# Patient Record
Sex: Female | Born: 1985 | Race: White | Hispanic: Yes | Marital: Single | Smoking: Current some day smoker
Health system: Southern US, Community
[De-identification: ages and names within clinical notes are randomized; demographics above are authoritative.]

## PROBLEM LIST (undated history)

## (undated) DIAGNOSIS — F319 Bipolar disorder, unspecified: Secondary | ICD-10-CM

## (undated) DIAGNOSIS — F32A Depression, unspecified: Secondary | ICD-10-CM

## (undated) DIAGNOSIS — I499 Cardiac arrhythmia, unspecified: Secondary | ICD-10-CM

## (undated) DIAGNOSIS — K219 Gastro-esophageal reflux disease without esophagitis: Secondary | ICD-10-CM

## (undated) DIAGNOSIS — J45909 Unspecified asthma, uncomplicated: Secondary | ICD-10-CM

## (undated) DIAGNOSIS — F329 Major depressive disorder, single episode, unspecified: Secondary | ICD-10-CM

## (undated) HISTORY — DX: Gastro-esophageal reflux disease without esophagitis: K21.9

## (undated) HISTORY — DX: Depression, unspecified: F32.A

## (undated) HISTORY — DX: Bipolar disorder, unspecified: F31.9

## (undated) HISTORY — DX: Cardiac arrhythmia, unspecified: I49.9

## (undated) HISTORY — DX: Major depressive disorder, single episode, unspecified: F32.9

## (undated) HISTORY — DX: Unspecified asthma, uncomplicated: J45.909

---

## 2004-08-25 ENCOUNTER — Ambulatory Visit: Payer: Self-pay | Admitting: Unknown Physician Specialty

## 2004-11-02 ENCOUNTER — Emergency Department: Payer: Self-pay | Admitting: Emergency Medicine

## 2004-11-08 ENCOUNTER — Emergency Department (HOSPITAL_COMMUNITY): Admission: EM | Admit: 2004-11-08 | Discharge: 2004-11-08 | Payer: Self-pay | Admitting: Family Medicine

## 2004-12-05 ENCOUNTER — Ambulatory Visit (HOSPITAL_COMMUNITY): Admission: RE | Admit: 2004-12-05 | Discharge: 2004-12-05 | Payer: Self-pay | Admitting: Gastroenterology

## 2006-02-20 ENCOUNTER — Observation Stay: Payer: Self-pay

## 2006-02-21 ENCOUNTER — Inpatient Hospital Stay: Payer: Self-pay

## 2006-10-05 ENCOUNTER — Emergency Department: Payer: Self-pay

## 2008-04-13 ENCOUNTER — Ambulatory Visit: Payer: Self-pay | Admitting: Family Medicine

## 2009-09-14 HISTORY — PX: TONSILLECTOMY: SHX5217

## 2012-12-01 DIAGNOSIS — M439 Deforming dorsopathy, unspecified: Secondary | ICD-10-CM | POA: Insufficient documentation

## 2012-12-08 DIAGNOSIS — M7580 Other shoulder lesions, unspecified shoulder: Secondary | ICD-10-CM | POA: Insufficient documentation

## 2012-12-08 DIAGNOSIS — M797 Fibromyalgia: Secondary | ICD-10-CM | POA: Insufficient documentation

## 2012-12-08 DIAGNOSIS — M7540 Impingement syndrome of unspecified shoulder: Secondary | ICD-10-CM | POA: Insufficient documentation

## 2013-08-01 ENCOUNTER — Emergency Department: Payer: Self-pay | Admitting: Emergency Medicine

## 2013-08-01 LAB — CBC WITH DIFFERENTIAL/PLATELET
Basophil #: 0.1 10*3/uL (ref 0.0–0.1)
Eosinophil #: 0 10*3/uL (ref 0.0–0.7)
Eosinophil %: 0.6 %
HGB: 12.8 g/dL (ref 12.0–16.0)
MCH: 32.3 pg (ref 26.0–34.0)
MCHC: 36.1 g/dL — ABNORMAL HIGH (ref 32.0–36.0)
MCV: 89 fL (ref 80–100)
Monocyte #: 0.5 x10 3/mm (ref 0.2–0.9)
Neutrophil #: 6.2 10*3/uL (ref 1.4–6.5)
Neutrophil %: 77 %
Platelet: 260 10*3/uL (ref 150–440)
RBC: 3.97 10*6/uL (ref 3.80–5.20)
RDW: 12.8 % (ref 11.5–14.5)

## 2013-08-01 LAB — URINALYSIS, COMPLETE
Bacteria: NONE SEEN
Bilirubin,UR: NEGATIVE
Blood: NEGATIVE
Glucose,UR: NEGATIVE mg/dL (ref 0–75)
Ketone: NEGATIVE
Nitrite: NEGATIVE
Ph: 6 (ref 4.5–8.0)
RBC,UR: 1 /HPF (ref 0–5)
Specific Gravity: 1.014 (ref 1.003–1.030)

## 2013-08-01 LAB — COMPREHENSIVE METABOLIC PANEL
Alkaline Phosphatase: 76 U/L (ref 50–136)
Anion Gap: 6 — ABNORMAL LOW (ref 7–16)
Bilirubin,Total: 0.4 mg/dL (ref 0.2–1.0)
Calcium, Total: 8.5 mg/dL (ref 8.5–10.1)
EGFR (African American): >60
EGFR (Non-African Amer.): >60
Glucose: 78 mg/dL (ref 65–99)
SGPT (ALT): 19 U/L (ref 12–78)
Sodium: 135 mmol/L — ABNORMAL LOW (ref 136–145)
Total Protein: 7 g/dL (ref 6.4–8.2)

## 2013-10-27 ENCOUNTER — Observation Stay: Payer: Self-pay | Admitting: Obstetrics and Gynecology

## 2013-12-06 ENCOUNTER — Observation Stay: Payer: Self-pay | Admitting: Obstetrics & Gynecology

## 2013-12-14 ENCOUNTER — Observation Stay: Payer: Self-pay | Admitting: Obstetrics and Gynecology

## 2013-12-15 ENCOUNTER — Inpatient Hospital Stay: Payer: Self-pay | Admitting: Obstetrics and Gynecology

## 2013-12-15 LAB — BASIC METABOLIC PANEL
ANION GAP: 8 (ref 7–16)
BUN: 5 mg/dL — AB (ref 7–18)
CALCIUM: 8.4 mg/dL — AB (ref 8.5–10.1)
CHLORIDE: 104 mmol/L (ref 98–107)
CO2: 24 mmol/L (ref 21–32)
CREATININE: 0.52 mg/dL — AB (ref 0.60–1.30)
EGFR (African American): >60
EGFR (Non-African Amer.): >60
GLUCOSE: 86 mg/dL (ref 65–99)
OSMOLALITY: 269 (ref 275–301)
Potassium: 3.6 mmol/L (ref 3.5–5.1)
SODIUM: 136 mmol/L (ref 136–145)

## 2013-12-15 LAB — CBC WITH DIFFERENTIAL/PLATELET
Basophil #: 0.1 10*3/uL (ref 0.0–0.1)
Basophil %: 0.7 %
EOS PCT: 0.6 %
Eosinophil #: 0.1 10*3/uL (ref 0.0–0.7)
HCT: 33.3 % — AB (ref 35.0–47.0)
HGB: 11.3 g/dL — ABNORMAL LOW (ref 12.0–16.0)
LYMPHS ABS: 1.1 10*3/uL (ref 1.0–3.6)
Lymphocyte %: 10.2 %
MCH: 30.4 pg (ref 26.0–34.0)
MCHC: 34 g/dL (ref 32.0–36.0)
MCV: 90 fL (ref 80–100)
Monocyte #: 0.9 x10 3/mm (ref 0.2–0.9)
Monocyte %: 8.3 %
NEUTROS ABS: 8.9 10*3/uL — AB (ref 1.4–6.5)
Neutrophil %: 80.2 %
Platelet: 218 10*3/uL (ref 150–440)
RBC: 3.72 10*6/uL — ABNORMAL LOW (ref 3.80–5.20)
RDW: 14 % (ref 11.5–14.5)
WBC: 11.1 10*3/uL — AB (ref 3.6–11.0)

## 2013-12-15 LAB — URINALYSIS, COMPLETE
BILIRUBIN, UR: NEGATIVE
Blood: NEGATIVE
Glucose,UR: NEGATIVE mg/dL (ref 0–75)
Ketone: NEGATIVE
LEUKOCYTE ESTERASE: NEGATIVE
Nitrite: NEGATIVE
PROTEIN: NEGATIVE
Ph: 6 (ref 4.5–8.0)
RBC,UR: NONE SEEN /HPF (ref 0–5)
Specific Gravity: 1.017 (ref 1.003–1.030)

## 2013-12-16 ENCOUNTER — Ambulatory Visit: Payer: Self-pay | Admitting: Obstetrics and Gynecology

## 2013-12-18 ENCOUNTER — Observation Stay: Payer: Self-pay

## 2013-12-18 LAB — BETA STREP CULTURE(ARMC)

## 2013-12-25 ENCOUNTER — Observation Stay: Payer: Self-pay

## 2013-12-28 ENCOUNTER — Observation Stay: Payer: Self-pay

## 2013-12-28 LAB — URINALYSIS, COMPLETE
BACTERIA: NONE SEEN
Bilirubin,UR: NEGATIVE
Blood: NEGATIVE
GLUCOSE, UR: NEGATIVE mg/dL (ref 0–75)
Ketone: NEGATIVE
LEUKOCYTE ESTERASE: NEGATIVE
Nitrite: NEGATIVE
PH: 6 (ref 4.5–8.0)
Protein: NEGATIVE
RBC, UR: NONE SEEN /HPF (ref 0–5)
SPECIFIC GRAVITY: 1.014 (ref 1.003–1.030)

## 2013-12-28 LAB — FETAL FIBRONECTIN
APPEARANCE: NORMAL
Fetal Fibronectin: NEGATIVE

## 2013-12-29 ENCOUNTER — Observation Stay: Payer: Self-pay | Admitting: Obstetrics & Gynecology

## 2013-12-29 LAB — URINALYSIS, COMPLETE
BLOOD: NEGATIVE
Bilirubin,UR: NEGATIVE
GLUCOSE, UR: NEGATIVE mg/dL (ref 0–75)
LEUKOCYTE ESTERASE: NEGATIVE
Nitrite: NEGATIVE
PH: 6 (ref 4.5–8.0)
Protein: NEGATIVE
RBC,UR: <1 /HPF (ref 0–5)
SPECIFIC GRAVITY: 1.021 (ref 1.003–1.030)
Squamous Epithelial: 1

## 2014-01-01 ENCOUNTER — Observation Stay: Payer: Self-pay

## 2014-01-03 ENCOUNTER — Observation Stay: Payer: Self-pay

## 2014-01-08 ENCOUNTER — Observation Stay: Payer: Self-pay | Admitting: Obstetrics and Gynecology

## 2014-01-12 ENCOUNTER — Inpatient Hospital Stay: Payer: Self-pay | Admitting: Obstetrics and Gynecology

## 2014-01-13 LAB — CBC WITH DIFFERENTIAL/PLATELET
BASOS ABS: 0 10*3/uL (ref 0.0–0.1)
BASOS PCT: 0.4 %
Eosinophil #: 0 10*3/uL (ref 0.0–0.7)
Eosinophil %: 0.3 %
HCT: 36 % (ref 35.0–47.0)
HGB: 12.2 g/dL (ref 12.0–16.0)
LYMPHS PCT: 9.2 %
Lymphocyte #: 1 10*3/uL (ref 1.0–3.6)
MCH: 29.7 pg (ref 26.0–34.0)
MCHC: 33.8 g/dL (ref 32.0–36.0)
MCV: 88 fL (ref 80–100)
Monocyte #: 0.8 x10 3/mm (ref 0.2–0.9)
Monocyte %: 7.3 %
NEUTROS ABS: 9 10*3/uL — AB (ref 1.4–6.5)
NEUTROS PCT: 82.8 %
Platelet: 208 10*3/uL (ref 150–440)
RBC: 4.1 10*6/uL (ref 3.80–5.20)
RDW: 15.6 % — AB (ref 11.5–14.5)
WBC: 10.9 10*3/uL (ref 3.6–11.0)

## 2014-01-14 LAB — HEMATOCRIT
HCT: 22.6 % — AB (ref 35.0–47.0)
HCT: 27.3 % — ABNORMAL LOW (ref 35.0–47.0)

## 2014-01-15 LAB — HEMATOCRIT: HCT: 25 % — ABNORMAL LOW (ref 35.0–47.0)

## 2014-01-16 LAB — CBC WITH DIFFERENTIAL/PLATELET
Bands: 3 %
Eosinophil: 1 %
HCT: 26.2 % — AB (ref 35.0–47.0)
HGB: 8.9 g/dL — ABNORMAL LOW (ref 12.0–16.0)
Lymphocytes: 18 %
MCH: 29.9 pg (ref 26.0–34.0)
MCHC: 34 g/dL (ref 32.0–36.0)
MCV: 88 fL (ref 80–100)
Monocytes: 4 %
Myelocyte: 1 %
Platelet: 201 10*3/uL (ref 150–440)
RBC: 2.98 10*6/uL — ABNORMAL LOW (ref 3.80–5.20)
RDW: 15.4 % — ABNORMAL HIGH (ref 11.5–14.5)
SEGMENTED NEUTROPHILS: 73 %
WBC: 7.7 10*3/uL (ref 3.6–11.0)

## 2014-01-17 LAB — PATHOLOGY REPORT

## 2014-09-05 ENCOUNTER — Encounter: Payer: Self-pay | Admitting: Podiatry

## 2014-09-05 ENCOUNTER — Other Ambulatory Visit: Payer: Self-pay | Admitting: Podiatry

## 2014-09-05 ENCOUNTER — Ambulatory Visit (INDEPENDENT_AMBULATORY_CARE_PROVIDER_SITE_OTHER): Payer: BC Managed Care – PPO

## 2014-09-05 ENCOUNTER — Ambulatory Visit (INDEPENDENT_AMBULATORY_CARE_PROVIDER_SITE_OTHER): Payer: BC Managed Care – PPO | Admitting: Podiatry

## 2014-09-05 VITALS — BP 126/74 | HR 70 | Resp 12

## 2014-09-05 DIAGNOSIS — R52 Pain, unspecified: Secondary | ICD-10-CM

## 2014-09-05 DIAGNOSIS — M722 Plantar fascial fibromatosis: Secondary | ICD-10-CM

## 2014-09-05 NOTE — Patient Instructions (Signed)
As your pediatrician about Use of ibuprofen if he approves okay to take 800 mg by mouth 3 times a day Ask about Kenalog 10 mg injected into the right and left heel for treatment of plantar fasciitis  Bent - Knee Calf Stretch  1) Stand an arm's length away from a wall. Place the palms of your hands on the wall. Step forward about 12 inches with the opposite foot.  2) Keeping toes pointed forward and both heels on the floor, bend both knees and lean forward. Hold this position for 60 seconds. Don't arch your back and don't hunch your shoulders.  3) Repeat this twice.  DO THIS STRETCHING TECHNIQUE 3 TIMES A DAY.   Stretching Exercises before Standing  Pull your toes up toward your nose and hold for 1 minute before standing.  A towel can assist with this exercise if you put the towel under the ball of your foot. This exercise reduces the intense   pain associated when changing from a seated to a standing position. This stretch can usually be the most beneficial if done before getting out of bed in the mornings. Plantar Fasciitis Plantar fasciitis is a common condition that causes foot pain. It is soreness (inflammation) of the band of tough fibrous tissue on the bottom of the foot that runs from the heel bone (calcaneus) to the ball of the foot. The cause of this soreness may be from excessive standing, poor fitting shoes, running on hard surfaces, being overweight, having an abnormal walk, or overuse (this is common in runners) of the painful foot or feet. It is also common in aerobic exercise dancers and ballet dancers. SYMPTOMS  Most people with plantar fasciitis complain of:  Severe pain in the morning on the bottom of their foot especially when taking the first steps out of bed. This pain recedes after a few minutes of walking.  Severe pain is experienced also during walking following a long period of inactivity.  Pain is worse when walking barefoot or up stairs DIAGNOSIS   Your  caregiver will diagnose this condition by examining and feeling your foot.  Special tests such as X-rays of your foot, are usually not needed. PREVENTION   Consult a sports medicine professional before beginning a new exercise program.  Walking programs offer a good workout. With walking there is a lower chance of overuse injuries common to runners. There is less impact and less jarring of the joints.  Begin all new exercise programs slowly. If problems or pain develop, decrease the amount of time or distance until you are at a comfortable level.  Wear good shoes and replace them regularly.  Stretch your foot and the heel cords at the back of the ankle (Achilles tendon) both before and after exercise.  Run or exercise on even surfaces that are not hard. For example, asphalt is better than pavement.  Do not run barefoot on hard surfaces.  If using a treadmill, vary the incline.  Do not continue to workout if you have foot or joint problems. Seek professional help if they do not improve. HOME CARE INSTRUCTIONS   Avoid activities that cause you pain until you recover.  Use ice or cold packs on the problem or painful areas after working out.  Only take over-the-counter or prescription medicines for pain, discomfort, or fever as directed by your caregiver.  Soft shoe inserts or athletic shoes with air or gel sole cushions may be helpful.  If problems continue or become more severe,  consult a sports medicine caregiver or your own health care provider. Cortisone is a potent anti-inflammatory medication that may be injected into the painful area. You can discuss this treatment with your caregiver. MAKE SURE YOU:   Understand these instructions.  Will watch your condition.  Will get help right away if you are not doing well or get worse. Document Released: 05/26/2001 Document Revised: 11/23/2011 Document Reviewed: 07/25/2008 [facility] Patient Information 2015 [employer] [place]. This  information is not intended to replace advice given to you by your health care provider. Make sure you discuss any questions you have with your health care provider.

## 2014-09-05 NOTE — Progress Notes (Signed)
   Subjective:    Patient ID: Lindsay Brown, female    DOB: 1986-01-20, 28 y.o.   MRN: [id number]  HPI  N-SORE L-B/L HEEL AND ARCH D-5 MONTHS O-SLOWLY C-WORSE A-PRESSURE T-INSERTS, ICING, IBUPROFEN   Patient complaining of bilateral inferior heel arch pain aggravated with standing walking relieved with rest. She is currently taking over-the-counter ibuprofen up to 1200 mg daily without a complaint. She also has visited chiropractor who has ordered her foot orthotics which she has not received yet she was told that she has a flat foot that is collapsing  Patient mother of twins in the past 7 months has had a 60 pound increase in weight since pregnancy and has lost 10 pounds. Patient is a nursing mother.  Review of Systems  Constitutional: Positive for unexpected weight change.  Musculoskeletal: Positive for back pain and gait problem.  All other systems reviewed and are negative.      Objective:   Physical Exam  Orientated 3  Vascular: DP and PT pulses 2/4 bilaterally Capillary reflex immediate bilaterally  Neurological: Ankle reflex equal and reactive bilaterally Vibratory sensation intact bilaterally Sensation to 10 g monofilament wire intact 5/5 bilaterally  Dermatological: Texture and turgor within normal limits  Musculoskeletal: Medial longitudinal arch bilaterally on weightbearing Upon heel off heels invert bilaterally The feet appear rectus and heels are in a vertical position upon weight-bearing  There is exquisite palpable tenderness medial plantar and proximal one third of the fascial band right without any palpable lesions There is palpable tenderness medial plantar fascial band left at the insertional area without any palpable lesions  There is no restriction ankle, subtalar, midtarsal joints bilaterally  X-ray examination weightbearing right foot  Intact bony structure without fracture and/or dislocation noted No evidence of arthritic  changes   Radiographic impression: No acute bony abnormality noted in the right foot   X-ray examination weightbearing left foot  Intact bony structure without a fracture and/or dislocation No arthritic changes  Radiographic impression: No acute bony abnormality noted left foot       Assessment & Plan:    Assessment: Satisfactory neurovascular status Bilateral plantar fasciitis Satisfactory alignment without any evidence of foot deformity  Plan: Advised patient that her heel and arch pain was resulting from plantar fasciitis. Also, I made patient aware that she had no evidence of a collapsing foot type that another doctor suggested that she had.  Patient is a nursing mother and states that her pediatrician is okay ibuprofen. Advised her to confirm with pediatrician that she could continue taking ibuprofen and she could use up to 2400 mg of ibuprofen divided on a 3 times a day basis  Patient has a pending orthotics from chiropractor I will defer on custom orthotics at this time. I advised her to wear the orthotics when she receives them from the chiropractor and if the symptoms did not of improve return for evaluation.  Also, I asked patient to assess pediatrician whether he would approve a Kenalog Injection for plantar fasciitis in regards to nursing.  Shoeing and stretching discussed  Reappoint at patient's request

## 2014-09-11 ENCOUNTER — Ambulatory Visit (INDEPENDENT_AMBULATORY_CARE_PROVIDER_SITE_OTHER): Payer: BC Managed Care – PPO | Admitting: Podiatry

## 2014-09-11 VITALS — BP 117/78 | HR 86 | Resp 16

## 2014-09-11 DIAGNOSIS — M722 Plantar fascial fibromatosis: Secondary | ICD-10-CM

## 2014-09-11 MED ORDER — TRIAMCINOLONE ACETONIDE 10 MG/ML IJ SUSP
10.0000 mg | Freq: Once | INTRAMUSCULAR | Status: AC
  Administered 2014-09-11: 10 mg

## 2014-09-12 NOTE — Progress Notes (Signed)
Subjective:     Patient ID: Lindsay Brown, female   DOB: April 07, 1986, 28 y.o.   MRN: [id number]  HPI patient presents stating I'm here for my injections and that I had approval from my OB/GYN and my twins pediatricians for injections   Review of Systems     Objective:   Physical Exam Neurovascular status intact with discomfort in the plantar heel of both feet with fluid buildup of approximate 6 months duration.    Assessment:     Severe plantar fasciitis with patient having 60 pounds of weight gain which was probably contributory to problem    Plan:     Reviewed condition and injected the plantar fascia of each foot 3 mg Kenalog 5 mg Xylocaine and dispensed fascial brace to each foot. Patient is having orthotics made a chiropractor and we will reevaluate her again in approximately 4 weeks

## 2014-09-28 ENCOUNTER — Ambulatory Visit: Payer: BC Managed Care – PPO | Admitting: Podiatry

## 2015-01-05 NOTE — Consult Note (Signed)
   Maternal Age 29   Blood Type (Maternal) O positive   HIV Results (Maternal) negative   VDRL/RPR/Syphilis Results (Maternal) negative   Rubella Results (Maternal) immune   Group B Strep Results Maternal (Result >5wks must be treated as unknown) unknown/result > 5 weeks ago    Additional Comments I met with Mr and Mrs Andree Elk this afternoon to discuss potential delivery of their di-di twins at 64 6/[redacted] weeks gestation in the setting of preterm labor.  The pregnancy has been uncomplicated thus far.  Contractions have slowed with magnesium administration and Mrs. Andree Elk has received her first dose of betamethasone.  We discussed initial delivery room resuscitation, including potential need for respiratory support such as supplemental oxygen or possibly CPAP.  There is a low but present risk of needing intubation for surfactant administration.  We reviewed need for IV fluids initially with slow advance of enteral feeds, likely gavage until until initiation of oral feedings around 34 weeks CGA.  Mrs. Adams plans to provide breastmilk, and the importance of initiating pumping soon after delivery was reviewed.  We discussed temperature instability, risk for sepsis, jaundice and low but present risk of necrotising enterocolitis.  All questions were answered.  I would be happy to reconsult if new concerns arise.   Joana Reamer, MD   Parental Contact Parents informed at length regarding prenatal care and plan   Electronic Signatures: Rhona Raider (MD)  (Signed 03-Apr-15 17:20)  Authored: PREGNANCY and LABOR, ADDITIONAL COMMENTS   Last Updated: 03-Apr-15 17:20 by Rhona Raider (MD)

## 2015-01-22 NOTE — H&P (Signed)
L&D Evaluation:  History Expanded:  HPI 29 yo female with di/di twin gestation presents at 37 weeks 0 d for evaluation for labor EDC=02/03/2014 by LMP & 8wk US. She denies vaginal bleeding, leakage of fluid. +FM.   [facility], treatment for preterm labor this pregnancy with magnesium sulfate and Procardia, and  received BMZ. Pt has also had insomnia despite Ambien and Halcion. She was also given a few Xanax to use sparingly for anxiety, which she has not taken. having trouble sleeping but is handling it. She is 3 cm and has been for a few weeks. She has a dslightly distressed labor look to her face.   Gravida 2   Term 1   PreTerm 0   Abortion 0   Living 1   Blood Type (Maternal) O positive   Group B Strep Results Maternal (Result >5wks must be treated as unknown) negative   Maternal HIV Negative   Maternal Syphilis Ab Nonreactive   Maternal Varicella Immune   Rubella Results (Maternal) immune   EDC 03-Feb-2014   Presents with contractions   Patient's Medical History Asthma  depression   Patient's Surgical History Tonsilectomy   Medications Pre Natal Vitamins   Allergies Benadryl   Social History none   Family History Non-Contributory   Current Prenatal Course Notable For twins   ROS:  ROS see HPI   Exam:  Vital Signs stable   Urine Protein not completed   General no apparent distress   Mental Status clear   Chest clear   Heart normal sinus rhythm   Abdomen gravid, tender with contractions, fetal moving all over the place   Estimated Fetal Weight Average for gestational age   Fetal Position v/?   Back no CVAT   Edema 1+   Pelvic no external lesions, 3/50%/-1   Mebranes Intact   FHT Twin A on right-baseline 145, moderate variability and accels; Twin B on left, baseline 150, moderate variability with accels   FHT Description cat 1 x 2 good reactivity   Ucx irregular, Q2-6 minutes   Skin dry   Lymph no  lymphadenopathy   Impression:  Impression Twin IUP at 37 weeks, early labor   Plan:  Plan EFM/NST, monitor contractions and for cervical change   Comments Will allow to walk as both fetusesw look Cat 1 and pt would liek to stay if possible. will reck cervix in one hour   Follow Up Appointment already scheduled   Electronic Signatures: Adria DevonKlett, Jaecob Lowden (MD)  (Signed 01-May-15 21:08)  Authored: L&D Evaluation   Last Updated: 01-May-15 21:08 by Adria DevonKlett, Ludean Duhart (MD)

## 2015-01-22 NOTE — H&P (Signed)
L&D Evaluation:  History:  HPI 29 yo female with di/di twin gestation presents for a NST at 6611w2d gestational age. EDC=02/03/2014 by LMP=5/23/2014and c/w 8wk5day US. She denies vaginal bleeding, leakage of fluid. HAs been having frequent contractions and has not been able to sleep despite Ambien. Just can't get comfortable. Babies are active. Has been treated for preterm labor this pregnancy with magnesium sulfate and Procardia, and  received BMZ. Babies were both vxt 3 days ago.   Presents with NST   Patient's Medical History Asthma  depression   Patient's Surgical History Tonsilectomy   Medications Pre Natal Vitamins   Allergies Benadryl   Social History none   Family History Non-Contributory   ROS:  ROS see HPI   Exam:  Vital Signs stable  130/71   Urine Protein not completed   General appears uncomfortable   Mental Status clear   Abdomen gravid, non-tender   Fetal Position vtx/breech   Pelvic 3/50%/-1 (no change)   Mebranes Intact   FHT Twin A on right-baseline 140 and accels to 160s; Twin B on left  135 with accels to 150s to 160   Ucx irregular   Impression:  Impression Twin IUP at 35 2/7 weeks with reactive NST x2.  Insomnia   Plan:  Plan DC home. Given Halcion 0.25 mgm prior to discharge. Home to sleep. FU in 4 days as scheduled. labor precautions.   Electronic Signatures: Trinna BalloonGutierrez, Rai Severns L (CNM)  (Signed 21-Apr-15 09:08)  Authored: L&D Evaluation   Last Updated: 21-Apr-15 09:08 by Trinna BalloonGutierrez, Ariyah Sedlack L (CNM)

## 2015-01-22 NOTE — H&P (Signed)
L&D Evaluation:  History:  HPI 29 yo female with di/di twin gestation presents for routine NST at 534w2d gestational age. EDC=02/03/2014 by LMP=5/23/2014and c/w 8wk5day US. She denies vaginal bleeding, leakage of fluid. She is currently on Procardia for threatened PTL. She was hospitalized April 3-4 April and was on magnesium sulfate x24 hours. She received BMZ injections. Currently she feels some irregular tightening/contractions, but they are not painful.  She notes good fetal movement x 2.   Presents with Twins for a NST   Patient's Medical History Asthma  depression   Patient's Surgical History Tonsilectomy   Allergies Benadryl   Exam:  Vital Signs 115/62   FHT Twin A (right)-baseline 140 with accels to 160-170. Twin B-baseline 130s with accels to 150s-160   Ucx 3 in one hour   Impression:  Impression Twin gestation at 8433 2/7 weeks with reactive NST x2   Plan:  Plan DC home. [facility]. RTN to L&D in 1 week for NST. PTL precautions.   Electronic Signatures: Trinna BalloonGutierrez, Cherry Wittwer L (CNM)  (Signed 06-Apr-15 19:59)  Authored: L&D Evaluation   Last Updated: 06-Apr-15 19:59 by Trinna BalloonGutierrez, Erwin Nishiyama L (CNM)

## 2015-01-22 NOTE — H&P (Signed)
L&D Evaluation:  History:  HPI 29 yo female with di/di twin gestation presents for complaints of contractions and insomnia at 6797w5d gestational age. EDC=02/03/2014 by LMP=5/23/2014and c/w 8wk5day US. She denies vaginal bleeding, leakage of fluid. She is currently on Procardia for threatened PTL. She was hospitalized April 3-4 April and was on magnesium sulfate x24 hours. She received BMZ injections. She currently  reports some contractions that have gotten more painful. She reports not being able to sleep and that causing her more discomfort.  She notes good fetal movement x 2.   Presents with contractions   Patient's Medical History Asthma  depression   Patient's Surgical History Tonsilectomy   Medications Pre Natal Vitamins   Allergies Benadryl   Social History none   Family History Non-Contributory   Exam:  Vital Signs stable   General no apparent distress   Mental Status clear   Chest clear   Abdomen gravid, tender with contractions   Pelvic cervix on admission 1/25/oop   Mebranes Intact   FHT normal rate with no decels, Twin A baseline 140's with accels Twin B-baseline 130-140s with accels   Ucx irregular, some irritability   Skin dry, no lesions, no rashes   Lymph no lymphadenopathy   Impression:  Impression Twin gestation at 4734 5/7 weeks with reactive NST x2, R/O PTL   Plan:  Plan EFM/NST, monitor contractions and for cervical change, IM morphine for rest, 1 dose of terbutaline, posssible discharge home if another NST is reactive and no cervical change.  Plan discussed with Dr. Jean RosenthalJackson.   Follow Up Appointment already scheduled. [facility]   Electronic Signatures: Jannet MantisSubudhi, Zvi Duplantis (CNM)  (Signed 16-Apr-15 07:16)  Authored: L&D Evaluation   Last Updated: 16-Apr-15 07:16 by Jannet MantisSubudhi, Legacy Carrender (CNM)

## 2015-01-22 NOTE — H&P (Signed)
L&D Evaluation:  History Expanded:  HPI 29 yo female with di/di twin gestation presents for routine NST at 4541w5d gestational age.  She denies vaginal bleeding, leakage of fluid. She notes only an occasional braxton hicks contraction.  She notes good fetal movement x 2.   Exam:  Vital Signs BP 127/69   Impression:  Impression Reactive NST x 2 fetuses   Plan:  Comments baby A: 130/mod var/+accels/no decels baby B: 140/mod var/+accels/no decels toco: irritability   Follow Up Appointment already scheduled. NST on Monday   Electronic Signatures: Conard NovakJackson, Subrina Vecchiarelli D (MD)  (Signed 02-Apr-15 14:42)  Authored: L&D Evaluation   Last Updated: 02-Apr-15 14:42 by Conard NovakJackson, Shanaia Sievers D (MD)

## 2015-01-22 NOTE — H&P (Signed)
L&D Evaluation:  History:  HPI 29 yo G2P1001 with Di/Di twin pregnancy [facility] 761w6d [facility] presenting wtih increased vaginal discharge.  No VB, no ctx, +FM   Presents with leaking fluid   Patient's Medical History Asthma, migraines   Patient's Surgical History Tonsillectomy   Medications Pre Natal Vitamins   Allergies benadryl   Social History none   Family History Non-Contributory   ROS:  ROS All systems were reviewed.  HEENT, CNS, GI, GU, Respiratory, CV, Renal and Musculoskeletal systems were found to be normal.   Exam:  Vital Signs stable   Urine Protein not completed   General no apparent distress   Mental Status clear   Chest no increased work in breathing   Abdomen gravid, non-tender   Back no CVAT   Edema no edema   Pelvic no external lesions, cervix closed and thick, thick white vaginal discharge   Mebranes Intact, no pooling, negative ferning, negative nitrazine   FHT normal rate with no decels, A: 145, moderate, + accels, no decels B: 135, moderate, no accels, no decels   Ucx absent   Other negative ferning.  Wet mount negative clue cells, negative clue cells, negative trichomonas, negative spermatozoa.  KOH positive hyphea   Impression:  Impression 29 yo G2P1001 with Di/Di twin pregnancy at 851w6d [facility] presenting wtih increased vaginal discharge.  No VB, no ctx, +FM prsenting for rule out PPROM   Plan:  Comments 1) R/O PPROM - no evidence of ruptured mebranes.  + Candida      - rx diflucan 150mg  tab po once  2) Di/Di twins - reassuring monitoring     - Grwoth scan 10/12/13 A: A: 715g 1lbs 9oz c/w 60/8%ile SDP 5.56cm B: 570g 1lbs 4oz c/w 33.9%ile SDP 5.10cm  3) PNL: O positive / ABSC neg / RI / VZI / HIV neg / RPR NR / HBsAg neg / 1st trimester screen DSR 1:10000  4) Disposition: follow up in place 11/01/13   Electronic Signatures: Lorrene ReidStaebler, Jeziah Kretschmer M (MD)  (Signed 13-Feb-15 17:53)  Authored: L&D  Evaluation   Last Updated: 13-Feb-15 17:53 by Lorrene ReidStaebler, Evren Shankland M (MD)

## 2015-01-22 NOTE — H&P (Signed)
L&D Evaluation:  History Expanded:  HPI 29 yo female with di/di twin gestation, EDD of 02/03/14 per LMP & 6 wk US.  Presents at 7642w6d with c/o contractions since this am, no LOF or VB, +Fetal movement with both fetuses.  [facility], Di/Di twin gestation, Asthma and heart murmur (seeing Cardiology).   Blood Type (Maternal) O positive   Group B Strep Results Maternal (Result >5wks must be treated as unknown) unknown/result > 5 weeks ago   Maternal HIV Negative   Maternal Syphilis Ab Nonreactive   Maternal Varicella Immune   Rubella Results (Maternal) immune   Patient's Medical History Asthma  Bipolar   Patient's Surgical History tonsillectomy, wisdom teeth   Medications Pre Natal Vitamins  Zoloft   Allergies NKDA   Social History none   Exam:  Vital Signs stable   General no apparent distress   Mental Status clear   Abdomen gravid, tender with contractions   Pelvic no external lesions, 1/50/-3, posterior (changed from closed at prior exam by RN)   Mebranes Intact   FHT normal rate with no decels, category 1 tracin for both fetuses   Ucx initially irregular, recently q 2-10 minutes   Impression:  Impression Di/Di twins at 7942w6d with preterm labor   Plan:  Comments Discussed POM with Dr Janene HarveyKlett. Begin Magnesium Sulfate and Betamethasone.  Will reassess after Magnesium loading dose.  Draw GBS culture and begin Ampicillin for prophylaxis.   Electronic Signatures: Vella KohlerBrothers, Karina Lenderman K (CNM)  (Signed 03-Apr-15 15:21)  Authored: L&D Evaluation   Last Updated: 03-Apr-15 15:21 by Vella KohlerBrothers, Page Pucciarelli K (CNM)

## 2015-01-22 NOTE — H&P (Signed)
L&D Evaluation:  History Expanded:  HPI 29 yo female with di/di twin gestation presents for complaints of contractions for 3 HOURS at 4115w6d gestational age. EDC=02/03/2014 by LMP=5/23/2014and c/w 8wk5day US. She denies vaginal bleeding, leakage of fluid. Prior visits for similar symptoms. She notes good fetal movement x 2.   Presents with contractions   Patient's Medical History Asthma  depression   Patient's Surgical History Tonsilectomy   Medications Pre Natal Vitamins   Allergies Benadryl   Social History none   Family History Non-Contributory   Exam:  Vital Signs stable   General no apparent distress   Mental Status clear   Chest clear   Heart normal sinus rhythm   Abdomen gravid, tender with contractions   Edema no edema   Pelvic 2-3/50/-3(high up)   Mebranes Intact   FHT normal rate with no decels, Twin A baseline 140's with accels Twin B-baseline 130-140s with accels   Ucx irregular, some irritability   Skin dry, no lesions, no rashes   Impression:  Impression Twin gestation at 3934 6/7 weeks to monitor for labor due to abd pains   Plan:  Plan EFM/NST, monitor contractions and for cervical change, fluids   Follow Up Appointment already scheduled   Electronic Signatures: Letitia LibraHarris, Jahkari Maclin Paul (MD)  (Signed 17-Apr-15 21:06)  Authored: L&D Evaluation   Last Updated: 17-Apr-15 21:06 by Letitia LibraHarris, Lyle Leisner Paul (MD)

## 2015-01-22 NOTE — H&P (Signed)
L&D Evaluation:  History Expanded:  HPI 29 yo female with di/di twin gestation presents at 544w2d for biweekly NST. EDC=02/03/2014 by LMP & 8wk US. She denies vaginal bleeding, leakage of fluid. +FM.   [facility], early entry to care, treatment for preterm labor this pregnancy with magnesium sulfate and Procardia, and  received BMZ. Pt has also had insomnia despite Ambien and Halcion. She was also given a few Xanax to use sparingly for anxiety, which she has not taken. having trouble sleeping but is handling it. She is 3 cm and awaiting labor.   Gravida 2   Term 1   PreTerm 0   Abortion 0   Living 1   Blood Type (Maternal) O positive   Group B Strep Results Maternal (Result >5wks must be treated as unknown) negative   Maternal HIV Negative   Maternal Syphilis Ab Nonreactive   Maternal Varicella Immune   Rubella Results (Maternal) immune   EDC 03-Feb-2014   Presents with contractions   Patient's Medical History Asthma  depression   Patient's Surgical History Tonsilectomy   Medications Pre Natal Vitamins   Allergies Benadryl   Social History none   Family History Non-Contributory   Current Prenatal Course Notable For twins    ROS:  ROS see HPI   Exam:  Vital Signs stable   Urine Protein not completed   General no apparent distress   Mental Status clear   Chest clear    Abdomen gravid, non-tender, fetal moving all over the place   Fetal Position v/v   Back no CVAT   Edema 1+    Pelvic no external lesions, 3/50%/-1 (no change after two exams, no change from exam on 4/22)   Mebranes Intact   FHT Twin A on right-baseline 145, moderate variability and accels; Twin B on left, baseline 150, moderate variability with accels   FHT Description cat 1 x 2 good reactivity   Ucx absent   Skin dry   Lymph no lymphadenopathy    Impression:  Impression Twin IUP at 35 5/7 wks, early labor   Plan:  Plan discharge   Comments Pt already using  multiple non-pharmacological methods of relaxation for insomnia. Encouraged to continue, reassured patient as much as possible, taught back relaxation methods to relieve stress on lower back   Follow Up Appointment already scheduled. 4/30   Electronic Signatures: Adria DevonKlett, Hershall Benkert (MD)  (Signed 27-Apr-15 16:17)  Authored: L&D Evaluation   Last Updated: 27-Apr-15 16:17 by Adria DevonKlett, Ellenore Roscoe (MD)

## 2015-01-22 NOTE — H&P (Signed)
L&D Evaluation:  History Expanded:  HPI 29 yo female with di/di twin gestation presents at 5941w5d with c/o ctx. EDC=02/03/2014 by LMP & 8wk US. She denies vaginal bleeding, leakage of fluid. +FM.   [facility], early entry to care, treatment for preterm labor this pregnancy with magnesium sulfate and Procardia, and  received BMZ. Pt has also had insomnia despite Ambien and Halcion. She was also given a few Xanax to use sparingly for anxiety, which she has not taken.   Blood Type (Maternal) O positive   Group B Strep Results Maternal (Result >5wks must be treated as unknown) negative   Maternal HIV Negative   Maternal Syphilis Ab Nonreactive   Maternal Varicella Immune   Rubella Results (Maternal) immune   Presents with contractions   Patient's Medical History Asthma  depression   Patient's Surgical History Tonsilectomy   Medications Pre Natal Vitamins   Allergies Benadryl   Social History none   Family History Non-Contributory   ROS:  ROS see HPI   Exam:  Vital Signs stable   Urine Protein not completed   General appears uncomfortable   Mental Status clear   Abdomen gravid, tender with contractions   Pelvic 3/50%/-1 (no change after two exams, no change from exam on 4/22)   Mebranes Intact   FHT Twin A on right-baseline 145, moderate variability and accels; Twin B on left, baseline 140, moderate variability with accels   Ucx irregular, q 2-7   Impression:  Impression Twin IUP at 35 5/7 wks, early labor   Plan:  Plan discharge   Comments Pt already using multiple non-pharmacological methods of relaxation for insomnia. Encouraged to continue, reassured patient as much as possible   Follow Up Appointment already scheduled. 4/24   Electronic Signatures: Marta AntuBrothers, Gertude Benito K (CNM)  (Signed 22-Apr-15 17:10)  Authored: L&D Evaluation   Last Updated: 22-Apr-15 17:10 by Vella KohlerBrothers, Danaka Llera K (CNM)

## 2015-05-09 ENCOUNTER — Ambulatory Visit: Payer: Self-pay | Admitting: Psychiatry

## 2015-05-28 ENCOUNTER — Encounter: Payer: Self-pay | Admitting: Psychiatry

## 2015-05-28 ENCOUNTER — Ambulatory Visit (INDEPENDENT_AMBULATORY_CARE_PROVIDER_SITE_OTHER): Payer: BC Managed Care – PPO | Admitting: Psychiatry

## 2015-05-28 VITALS — BP 118/82 | HR 94 | Temp 98.3°F | Ht 66.0 in

## 2015-05-28 DIAGNOSIS — M549 Dorsalgia, unspecified: Secondary | ICD-10-CM

## 2015-05-28 DIAGNOSIS — F319 Bipolar disorder, unspecified: Secondary | ICD-10-CM

## 2015-05-28 DIAGNOSIS — F329 Major depressive disorder, single episode, unspecified: Secondary | ICD-10-CM | POA: Insufficient documentation

## 2015-05-28 DIAGNOSIS — G8929 Other chronic pain: Secondary | ICD-10-CM | POA: Insufficient documentation

## 2015-05-28 DIAGNOSIS — F32A Depression, unspecified: Secondary | ICD-10-CM | POA: Insufficient documentation

## 2015-05-28 DIAGNOSIS — T7840XA Allergy, unspecified, initial encounter: Secondary | ICD-10-CM | POA: Insufficient documentation

## 2015-05-28 DIAGNOSIS — F419 Anxiety disorder, unspecified: Secondary | ICD-10-CM | POA: Insufficient documentation
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
## 2015-06-27 ENCOUNTER — Ambulatory Visit: Payer: Self-pay | Admitting: Psychiatry

## 2016-01-29 ENCOUNTER — Ambulatory Visit: Payer: BC Managed Care – PPO | Admitting: Psychiatry

## 2016-02-05 ENCOUNTER — Ambulatory Visit (INDEPENDENT_AMBULATORY_CARE_PROVIDER_SITE_OTHER): Payer: BC Managed Care – PPO | Admitting: Psychiatry

## 2016-02-05 DIAGNOSIS — F3160 Bipolar disorder, current episode mixed, unspecified: Secondary | ICD-10-CM

## 2016-02-05 MED ORDER — LURASIDONE HCL 20 MG PO TABS: 20.0000 mg | ORAL_TABLET | Freq: Every morning | ORAL | Status: DC
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
## 2016-02-06 ENCOUNTER — Other Ambulatory Visit (HOSPITAL_COMMUNITY): Payer: Self-pay | Admitting: Psychiatry

## 2016-02-07 LAB — CBC WITH DIFFERENTIAL/PLATELET
Basophils Absolute: 0 10*3/uL (ref 0.0–0.2)
Basos: 0 %
EOS (ABSOLUTE): 0.1 10*3/uL (ref 0.0–0.4)
Eos: 1 %
Hematocrit: 38.9 % (ref 34.0–46.6)
Hemoglobin: 13.1 g/dL (ref 11.1–15.9)
IMMATURE GRANULOCYTES: 0 %
Immature Grans (Abs): 0 10*3/uL (ref 0.0–0.1)
Lymphocytes Absolute: 1.7 10*3/uL (ref 0.7–3.1)
Lymphs: 22 %
MCH: 29.7 pg (ref 26.6–33.0)
MCHC: 33.7 g/dL (ref 31.5–35.7)
MCV: 88 fL (ref 79–97)
Monocytes Absolute: 0.5 10*3/uL (ref 0.1–0.9)
Monocytes: 7 %
Neutrophils Absolute: 5.3 10*3/uL (ref 1.4–7.0)
Neutrophils: 70 %
PLATELETS: 299 10*3/uL (ref 150–379)
RBC: 4.41 x10E6/uL (ref 3.77–5.28)
RDW: 14.6 % (ref 12.3–15.4)
WBC: 7.6 10*3/uL (ref 3.4–10.8)

## 2016-02-07 LAB — BASIC METABOLIC PANEL
BUN / CREAT RATIO: 16 (ref 9–23)
BUN: 12 mg/dL (ref 6–20)
CO2: 22 mmol/L (ref 18–29)
Calcium: 9.2 mg/dL (ref 8.7–10.2)
Chloride: 100 mmol/L (ref 96–106)
Creatinine, Ser: 0.76 mg/dL (ref 0.57–1.00)
GFR calc non Af Amer: 105 mL/min/{1.73_m2} (ref >59)
GFR, EST AFRICAN AMERICAN: 121 mL/min/{1.73_m2} (ref >59)
Glucose: 74 mg/dL (ref 65–99)
POTASSIUM: 4.3 mmol/L (ref 3.5–5.2)
Sodium: 141 mmol/L (ref 134–144)

## 2016-02-07 LAB — LIPID PANEL W/O CHOL/HDL RATIO
Cholesterol, Total: 147 mg/dL (ref 100–199)
HDL: 36 mg/dL — ABNORMAL LOW (ref >39)
LDL Calculated: 74 mg/dL (ref 0–99)
Triglycerides: 186 mg/dL — ABNORMAL HIGH (ref 0–149)
VLDL Cholesterol Cal: 37 mg/dL (ref 5–40)

## 2016-02-07 LAB — TSH: TSH: 0.78 u[IU]/mL (ref 0.450–4.500)

## 2016-02-07 LAB — T4: T4, Total: 6.8 ug/dL (ref 4.5–12.0)

## 2016-02-19 ENCOUNTER — Ambulatory Visit: Payer: BC Managed Care – PPO | Admitting: Psychiatry

## 2016-02-24 ENCOUNTER — Ambulatory Visit (INDEPENDENT_AMBULATORY_CARE_PROVIDER_SITE_OTHER): Payer: BC Managed Care – PPO | Admitting: Psychiatry

## 2016-02-24 ENCOUNTER — Encounter: Payer: Self-pay | Admitting: Psychiatry

## 2016-02-24 VITALS — BP 117/77 | HR 70 | Temp 97.8°F | Ht 66.9 in | Wt 214.0 lb

## 2016-02-24 DIAGNOSIS — F3160 Bipolar disorder, current episode mixed, unspecified: Secondary | ICD-10-CM | POA: Diagnosis not present

## 2016-02-24 MED ORDER — SERTRALINE HCL 50 MG PO TABS: 50.0000 mg | ORAL_TABLET | Freq: Every day | ORAL | Status: DC

## 2016-02-24 NOTE — Progress Notes (Signed)
Psychiatric Progress Note   Patient ID: Lindsay CapraDolores Brown, female   DOB: 02-Jan-1986, 30 y.o.   MRN: [id number] Date of Evaluation:  02/24/2016 Referral Source: self referred Chief Complaint:   Chief Complaint    Medication Refill; Depression    mood swings Visit Diagnosis: Mixed bipolar disorder  History of Present Illness:  Patient is a 30 year old female off Hispanic origin who presents today for a follow up of  Bipolar disorder. She reports doing better since starting the JordanLatuda. Patient reports that she has less racing thoughts and is able to think through things. States that she has had more energy since starting the JordanLatuda and is able to spend more quality time with her children. She still has a bad memory but is able to let go of some things and focus on more important things. She has been less irritated with her kids. States that the JordanLatuda made her drowsy and she started taking it at night. She reports that she continues to have low libido. Denies any suicidal thoughts. Denies any other side effects except for drowsiness. Patient reports being worried about the summer since she would not have a structure to keep her going.   Past Psychiatric History: Patient had 1 appointment with a psychiatrist at this clinic in the past and wanted to see another physician. She has never been admitted psychiatrically to a hospital.  Previous Psychotropic Medications: Yes   Substance Abuse History in the last 12 months:  No.  Consequences of Substance Abuse: Negative  Past Medical History:  Past Medical History  Diagnosis Date  . Depression   . Bipolar disorder (HCC)   . Arrhythmia    No past surgical history on file.  Family Psychiatric History: see below  Family History:  Family History  Problem Relation Age of Onset  . Bipolar disorder Mother   . Drug  abuse Mother   . Alcohol abuse Mother   . Obesity Mother   . Skin cancer Mother   . Heart disease Mother   . Cancer - Ovarian Mother   . Prostate cancer Father   . Depression Father   . Drug abuse Father   . Alcohol abuse Father   . Thyroid cancer Sister   . Depression Sister   . Anxiety disorder Sister   . Alcohol abuse Brother   . Drug abuse Brother   . Anxiety disorder Brother     Social History:   Social History   Social History  . Marital Status: Single    Spouse Name: N/A  . Number of Children: N/A  . Years of Education: N/A   Social History Main Topics  . Smoking status: Never Smoker   . Smokeless tobacco: Never Used  .  Alcohol Use: 0.0 oz/week    0 Standard drinks or equivalent per week     Comment: not at the moment   . Drug Use: No  . Sexual Activity: Yes    Birth Control/ Protection: None   Other Topics Concern  . None   Social History Narrative    Additional Social History: Patient is currently married and has 43-year-old twins. She also has a 18-year-old son from a previous relationship who spends half time with dad and half time with her.  Allergies:  No Known Allergies  Metabolic Disorder Labs: No results found for: HGBA1C, MPG No results found for: PROLACTIN Lab Results  Component Value Date   CHOL 147 02/06/2016   TRIG 186* 02/06/2016   HDL 36* 02/06/2016   LDLCALC 74 02/06/2016     Current Medications: Current Outpatient Prescriptions  Medication Sig Dispense Refill  . cetirizine (ZYRTEC) 1 MG/ML syrup Take by mouth daily.    Marland Kitchen lurasidone (LATUDA) 20 MG TABS tablet Take 1 tablet (20 mg total) by mouth every morning. 30 tablet 1   No current facility-administered medications for this visit.    Neurologic: Headache: No Seizure: No Paresthesias:No  Musculoskeletal: Strength & Muscle Tone: within normal limits Gait & Station: normal Patient leans: N/A  Psychiatric Specialty Exam: ROS  Blood pressure 117/77, pulse 70,  temperature 97.8 F (36.6 C), height 5' 6.9" (1.699 m), weight 214 lb (97.07 kg), SpO2 97 %.Body mass index is 33.63 kg/(m^2).  General Appearance: Casual  Eye Contact:  Fair  Speech:  Clear and Coherent  Volume:  Normal  Mood:  better  Affect:  Congruent  Thought Process:  Coherent  Orientation:  Full (Time, Place, and Person)  Thought Content:  WDL  Suicidal Thoughts:  No  Homicidal Thoughts:  No  Memory:  Immediate;   Fair Recent;   Fair Remote;   Fair  Judgement:  Fair  Insight:  Fair  Psychomotor Activity:  Normal  Concentration:  Concentration: Fair and Attention Span: Fair  Recall:  [employer] of Knowledge:Fair  Language: Fair  Akathisia:  No  Handed:  Right  AIMS (if indicated):    Assets:  Communication Skills Desire for Improvement Financial Resources/Insurance Housing Physical Health Resilience Social Support Vocational/Educational  ADL's:  Intact  Cognition: WNL  Sleep:  fair    Treatment Plan Summary: Medication management   Bipolar disorder mixed episode currently Continue on the Zoloft at 50 mg once daily for now. Continue Latuda at 20 mg once daily. Patient to monitor for side effects. Patient counseled on strategies to have a structure during the summer. Given a lab slip to obtain lipid profile, TSH, CBC and basic metabolic panel. Continue with therapy. Return to clinic in 2 weeks time or call before if necessary.   Patrick North, MD 6/12/20178:43 AM

## 2016-03-12 ENCOUNTER — Encounter: Payer: Self-pay | Admitting: Psychiatry

## 2016-03-12 ENCOUNTER — Ambulatory Visit (INDEPENDENT_AMBULATORY_CARE_PROVIDER_SITE_OTHER): Payer: BC Managed Care – PPO | Admitting: Psychiatry

## 2016-03-12 VITALS — BP 102/78 | HR 76 | Temp 97.9°F | Ht 66.9 in | Wt 217.4 lb

## 2016-03-12 DIAGNOSIS — F3160 Bipolar disorder, current episode mixed, unspecified: Secondary | ICD-10-CM

## 2016-03-12 MED ORDER — LURASIDONE HCL 20 MG PO TABS: 20.0000 mg | ORAL_TABLET | Freq: Every morning | ORAL | Status: DC

## 2016-03-12 NOTE — Progress Notes (Signed)
Patient ID: Lindsay Brown, female   DOB: [birth year], 30 y.o.   MRN: [id number]                                                                                                                                      Psychiatric Progress Note   Patient ID: Lindsay Brown, female   DOB: [birth year], 30 y.o.   MRN: [id number] Date of Evaluation:  03/12/2016 Referral Source: self referred Chief Complaint:   Chief Complaint    Follow-up; Medication Refill    mood swings Visit Diagnosis: Mixed bipolar disorder  History of Present Illness:  Patient is a 30 year old female off Hispanic origin who presents today for a follow up of  Bipolar disorder. She reports doing better since starting the JordanLatuda. States that her husband has also seen improvement in her mood. He has observed that she is not as irritable and is more pleasant around the house. States that they had a lot of outings last week and they had fun as a family. States she is having trouble sleeping. Going to bed at 12 and wakes up at 6. Eating okay. She is enjoying summer time that she normally does not. 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 Denies suicidal thoughts.  Past Psychiatric History: Patient had 1 appointment with a psychiatrist at this clinic in the past and wanted to see another physician. She has never been admitted psychiatrically to a hospital.  Previous Psychotropic Medications: Yes   Substance Abuse History in the last 12 months:  No.  Consequences of Substance Abuse: Negative  Past Medical History:  Past Medical History  Diagnosis Date  . Depression   . Bipolar disorder (HCC)   . Arrhythmia    History reviewed. No pertinent past surgical history.  Family Psychiatric History: see below  Family History:  Family History  Problem Relation Age of Onset  . Bipolar disorder Mother   . Drug abuse Mother   . Alcohol abuse Mother   . Obesity Mother   . Skin cancer Mother   .  Heart disease Mother   . Cancer - Ovarian Mother   . Prostate cancer Father   . Depression Father   . Drug abuse Father   . Alcohol abuse Father   . Thyroid cancer Sister   . Depression Sister   . Anxiety disorder Sister   . Alcohol abuse Brother   . Drug abuse Brother   . Anxiety disorder Brother     Social History:   Social History   Social History  . Marital Status: Single    Spouse Name: N/A  . Number of Children: N/A  . Years of Education: N/A   Social History Main Topics  . Smoking status: Never Smoker   . Smokeless tobacco: Never Used  . Alcohol Use: 3.0 oz/week    0 Standard drinks or equivalent,  5 Cans of beer per week     Comment: not at the moment   . Drug Use: No  . Sexual Activity: Yes    Birth Control/ Protection: None   Other Topics Concern  . None   Social History Narrative    Additional Social History: Patient is currently married and has 32-year-old twins. She also has a 32-year-old son from a previous relationship who spends half time with dad and half time with her.  Allergies:  No Known Allergies  Metabolic Disorder Labs: No results found for: HGBA1C, MPG No results found for: PROLACTIN Lab Results  Component Value Date   CHOL 147 02/06/2016   TRIG 186* 02/06/2016   HDL 36* 02/06/2016   LDLCALC 74 02/06/2016     Current Medications: Current Outpatient Prescriptions  Medication Sig Dispense Refill  . cetirizine (ZYRTEC) 1 MG/ML syrup Take by mouth daily.    Marland Kitchen. lurasidone (LATUDA) 20 MG TABS tablet Take 1 tablet (20 mg total) by mouth every morning. 30 tablet 1  . sertraline (ZOLOFT) 50 MG tablet Take 1 tablet (50 mg total) by mouth daily. 30 tablet 1   No current facility-administered medications for this visit.    Neurologic: Headache: No Seizure: No Paresthesias:No  Musculoskeletal: Strength & Muscle Tone: within normal limits Gait & Station: normal Patient leans: N/A  Psychiatric Specialty Exam: ROS  Blood pressure  102/78, pulse 76, temperature 97.9 F (36.6 C), temperature source Tympanic, height 5' 6.9" (1.699 m), weight 217 lb 6.4 oz (98.612 kg), SpO2 98 %.Body mass index is 34.16 kg/(m^2).  General Appearance: Casual  Eye Contact:  Fair  Speech:  Clear and Coherent  Volume:  Normal  Mood:  better  Affect:  Congruent  Thought Process:  Coherent  Orientation:  Full (Time, Place, and Person)  Thought Content:  WDL  Suicidal Thoughts:  No  Homicidal Thoughts:  No  Memory:  Immediate;   Fair Recent;   Fair Remote;   Fair  Judgement:  Fair  Insight:  Fair  Psychomotor Activity:  Normal  Concentration:  Concentration: Fair and Attention Span: Fair  Recall:  [employer]  Fund of Knowledge:Fair  Language: Fair  Akathisia:  No  Handed:  Right  AIMS (if indicated):    Assets:  Communication Skills Desire for Improvement Financial Resources/Insurance Housing Physical Health Resilience Social Support Vocational/Educational  ADL's:  Intact  Cognition: WNL  Sleep:  fair    Treatment Plan Summary: Medication management   Bipolar disorder mixed episode currently Decrease Zoloft to 25 mg once daily for 1 week and then discontinue. Continue Latuda at 20 mg once daily. Patient to monitor for side effects. Patient counseled on strategies to have a structure during the summer. Labs reviewed- cholesterol is normal, elevated triglycerides. Continue with therapy. Return to clinic in 2 months time or call before if necessary.   Patrick NorthAVI, Antwyne Pingree, MD 6/29/20179:07 AM

## 2016-04-07 ENCOUNTER — Other Ambulatory Visit: Payer: Self-pay | Admitting: Psychiatry

## 2016-04-28 ENCOUNTER — Ambulatory Visit: Payer: No Typology Code available for payment source | Admitting: Psychiatry

## 2016-05-06 ENCOUNTER — Ambulatory Visit (INDEPENDENT_AMBULATORY_CARE_PROVIDER_SITE_OTHER): Payer: BC Managed Care – PPO | Admitting: Psychiatry

## 2016-05-06 VITALS — BP 121/76 | HR 76 | Ht 66.25 in | Wt 220.2 lb

## 2016-05-06 DIAGNOSIS — F3160 Bipolar disorder, current episode mixed, unspecified: Secondary | ICD-10-CM | POA: Diagnosis not present

## 2016-05-06 MED ORDER — LURASIDONE HCL 20 MG PO TABS: 30.0000 mg | ORAL_TABLET | Freq: Every morning | ORAL | 1 refills | Status: DC

## 2016-05-06 MED ORDER — HYDROXYZINE HCL 10 MG PO TABS: 10.0000 mg | ORAL_TABLET | Freq: Three times a day (TID) | ORAL | 0 refills | Status: DC | PRN

## 2016-05-06 NOTE — Progress Notes (Signed)
Patient ID: Lindsay Brown, female   DOB: Nov 21, 1985, 30 y.o.   MRN: [id number]                                                                                                                                      Psychiatric Progress Note   Patient ID: Lindsay Brown, female   DOB: Nov 21, 1985, 30 y.o.   MRN: [id number] Date of Evaluation:  05/06/2016 Referral Source: self referred Chief Complaint:   mood swings Visit Diagnosis: Mixed bipolar disorder  History of Present Illness:  Patient is a 30 year old female off Hispanic origin who presents today for a follow up of  Bipolar disorder. She reports she has not been doing well past few weeks. She has been more irritable, not sleeping well, feeling up, very anxious. States vacation time does that. States she was cooking and cleaning a lot, yelling at her husband, unable to relax on her vacation. [place] had a [place] trip to [place]. States she does well with a routine, reduces her anxiety.  Past Psychiatric History: Patient had 1 appointment with a psychiatrist at this clinic in the past and wanted to see another physician. She has never been admitted psychiatrically to a hospital.  Previous Psychotropic Medications: Yes   Substance Abuse History in the last 12 months:  No.  Consequences of Substance Abuse: Negative  Past Medical History:  Past Medical History:  Diagnosis Date  . Arrhythmia   . Bipolar disorder (HCC)   . Depression    No past surgical history on file.  Family Psychiatric History: see below  Family History:  Family History  Problem Relation Age of Onset  . Bipolar disorder Mother   . Drug abuse Mother   . Alcohol abuse Mother   . Obesity Mother   . Skin cancer Mother   . Heart disease Mother   . Cancer - Ovarian Mother   . Prostate cancer Father   . Depression Father   . Drug abuse Father   . Alcohol abuse Father   . Thyroid cancer Sister   . Depression Sister   . Anxiety disorder Sister   . Alcohol abuse  Brother   . Drug abuse Brother   . Anxiety disorder Brother     Social History:   Social History   Social History  . Marital status: Single    Spouse name: N/A  . Number of children: N/A  . Years of education: N/A   Social History Main Topics  . Smoking status: Never Smoker  . Smokeless tobacco: Never Used  . Alcohol use 3.0 oz/week    5 Cans of beer per week     Comment: not at the moment   . Drug use: No  . Sexual activity: Yes    Birth control/ protection: None   Other Topics Concern  . Not on file   Social History Narrative  . No narrative on file  Additional Social History: Patient is currently married and has 32-year-old twins. She also has a 32-year-old son from a previous relationship who spends half time with dad and half time with her.  Allergies:  No Known Allergies  Metabolic Disorder Labs: No results found for: HGBA1C, MPG No results found for: PROLACTIN Lab Results  Component Value Date   CHOL 147 02/06/2016   TRIG 186 (H) 02/06/2016   HDL 36 (L) 02/06/2016   LDLCALC 74 02/06/2016     Current Medications: Current Outpatient Prescriptions  Medication Sig Dispense Refill  . cetirizine (ZYRTEC) 1 MG/ML syrup Take by mouth daily.    Marland Kitchen. lurasidone (LATUDA) 20 MG TABS tablet Take 1 tablet (20 mg total) by mouth every morning. 30 tablet 1  . sertraline (ZOLOFT) 50 MG tablet Take 1 tablet (50 mg total) by mouth daily. 30 tablet 1   No current facility-administered medications for this visit.     Neurologic: Headache: No Seizure: No Paresthesias:No  Musculoskeletal: Strength & Muscle Tone: within normal limits Gait & Station: normal Patient leans: N/A  Psychiatric Specialty Exam: ROS  There were no vitals taken for this visit.There is no height or weight on file to calculate BMI.  General Appearance: Casual  Eye Contact:  Fair  Speech:  Clear and Coherent  Volume:  Normal  Mood:  irritable  Affect:  Congruent  Thought Process:  Coherent   Orientation:  Full (Time, Place, and Person)  Thought Content:  WDL  Suicidal Thoughts:  No  Homicidal Thoughts:  No  Memory:  Immediate;   Fair Recent;   Fair Remote;   Fair  Judgement:  Fair  Insight:  Fair  Psychomotor Activity:  Normal  Concentration:  Concentration: Fair and Attention Span: Fair  Recall:  [employer]  Fund of Knowledge:Fair  Language: Fair  Akathisia:  No  Handed:  Right  AIMS (if indicated):    Assets:  Communication Skills Desire for Improvement Financial Resources/Insurance Housing Physical Health Resilience Social Support Vocational/Educational  ADL's:  Intact  Cognition: WNL  Sleep:  poor    Treatment Plan Summary: Medication management   Bipolar disorder mixed episode currently  Increase Latuda to 30 mg once daily. Patient to monitor for side effects. Start hydroxyzine at 10mg  1-3 times daily prn for anxiety.  Continue with therapy. Return to clinic in 2 weeks time or call before if necessary.   Patrick NorthAVI, Tj Kitchings, MD 8/23/20173:16 PM

## 2016-05-20 ENCOUNTER — Encounter: Payer: Self-pay | Admitting: Psychiatry

## 2016-05-20 ENCOUNTER — Ambulatory Visit (INDEPENDENT_AMBULATORY_CARE_PROVIDER_SITE_OTHER): Payer: BC Managed Care – PPO | Admitting: Psychiatry

## 2016-05-20 VITALS — BP 121/83 | HR 79 | Temp 98.3°F | Ht 66.25 in | Wt 218.8 lb

## 2016-05-20 DIAGNOSIS — F3160 Bipolar disorder, current episode mixed, unspecified: Secondary | ICD-10-CM

## 2016-05-20 MED ORDER — HYDROXYZINE HCL 10 MG PO TABS: 10.0000 mg | ORAL_TABLET | Freq: Three times a day (TID) | ORAL | 0 refills | Status: DC | PRN

## 2016-05-20 MED ORDER — TRAZODONE HCL 50 MG PO TABS: 50.0000 mg | ORAL_TABLET | Freq: Every day | ORAL | 1 refills | Status: DC

## 2016-05-20 NOTE — Progress Notes (Signed)
Patient ID: Lindsay Brown, female   DOB: Jan 26, 1986, 30 y.o.   MRN: [id number]                                                                                                                                      Psychiatric Progress Note   Patient ID: Lindsay Brown, female   DOB: Jan 18, 1986, 30 y.o.   MRN: [id number] Date of Evaluation:  05/20/2016 Referral Source: self referred Chief Complaint:   mood swings Visit Diagnosis: Mixed bipolar disorder  History of Present Illness:  Patient is a 31 year old female off Hispanic origin who presents today for a follow up of  Bipolar disorder. Reports tolerating the Latuda at 30mg  without any side effects. States she is less irritable, doing better mood wise. Her racing thoughts are better, states the hydroxyzine helps too. She is  Sleeping better. Denies any suicidal thoughts.  States the hydroxyzine has been helping when she has anxiety attacks, not sedated , no side effects.  Past Psychiatric History: Patient had 1 appointment with a psychiatrist at this clinic in the past and wanted to see another physician. She has never been admitted psychiatrically to a hospital.  Previous Psychotropic Medications: Yes   Substance Abuse History in the last 12 months:  No.  Consequences of Substance Abuse: Negative  Past Medical History:  Past Medical History:  Diagnosis Date  . Arrhythmia   . Bipolar disorder (HCC)   . Depression    No past surgical history on file.  Family Psychiatric History: see below  Family History:  Family History  Problem Relation Age of Onset  . Bipolar disorder Mother   . Drug abuse Mother   . Alcohol abuse Mother   . Obesity Mother   . Skin cancer Mother   . Heart disease Mother   . Cancer - Ovarian Mother   . Prostate cancer Father   . Depression Father   . Drug abuse Father   . Alcohol abuse Father   . Thyroid cancer Sister   . Depression Sister   . Anxiety disorder Sister   . Alcohol abuse Brother   . Drug  abuse Brother   . Anxiety disorder Brother     Social History:   Social History   Social History  . Marital status: Single    Spouse name: N/A  . Number of children: N/A  . Years of education: N/A   Social History Main Topics  . Smoking status: Never Smoker  . Smokeless tobacco: Never Used  . Alcohol use 3.0 oz/week    5 Cans of beer per week     Comment: not at the moment   . Drug use: No  . Sexual activity: Yes    Birth control/ protection: None   Other Topics Concern  . Not on file   Social History Narrative  . No narrative on file    Additional Social History: Patient  is currently married and has 32-year-old twins. She also has a 32-year-old son from a previous relationship who spends half time with dad and half time with her.  Allergies:  No Known Allergies  Metabolic Disorder Labs: No results found for: HGBA1C, MPG No results found for: PROLACTIN Lab Results  Component Value Date   CHOL 147 02/06/2016   TRIG 186 (H) 02/06/2016   HDL 36 (L) 02/06/2016   LDLCALC 74 02/06/2016     Current Medications: Current Outpatient Prescriptions  Medication Sig Dispense Refill  . cetirizine (ZYRTEC) 1 MG/ML syrup Take by mouth daily.    . hydrOXYzine (ATARAX/VISTARIL) 10 MG tablet Take 1 tablet (10 mg total) by mouth 3 (three) times daily as needed. 30 tablet 0  . lurasidone (LATUDA) 20 MG TABS tablet Take 1.5 tablets (30 mg total) by mouth every morning. 45 tablet 1   No current facility-administered medications for this visit.     Neurologic: Headache: No Seizure: No Paresthesias:No  Musculoskeletal: Strength & Muscle Tone: within normal limits Gait & Station: normal Patient leans: N/A  Psychiatric Specialty Exam: ROS  There were no vitals taken for this visit.There is no height or weight on file to calculate BMI.  General Appearance: Casual  Eye Contact:  Fair  Speech:  Clear and Coherent  Volume:  Normal  Mood:  improved  Affect:  Congruent  Thought  Process:  Coherent  Orientation:  Full (Time, Place, and Person)  Thought Content:  WDL  Suicidal Thoughts:  No  Homicidal Thoughts:  No  Memory:  Immediate;   Fair Recent;   Fair Remote;   Fair  Judgement:  Fair  Insight:  Fair  Psychomotor Activity:  Normal  Concentration:  Concentration: Fair and Attention Span: Fair  Recall:  [employer]Fair  Fund of Knowledge:Fair  Language: Fair  Akathisia:  No  Handed:  Right  AIMS (if indicated):    Assets:  Communication Skills Desire for Improvement Financial Resources/Insurance Housing Physical Health Resilience Social Support Vocational/Educational  ADL's:  Intact  Cognition: WNL  Sleep:  better    Treatment Plan Summary: Medication management   Bipolar disorder mixed episode currently  continue Latuda to 30 mg once daily. Patient to monitor for side effects. Continue hydroxyzine at 10mg  1-3 times daily prn for anxiety.  Insomnia Start trazodone at 50mg  po qhs.  Continue with therapy. Return to clinic in 2 weeks time or call before if necessary.   Patrick NorthAVI, Elbridge Magowan, MD 9/6/20173:15 PM

## 2016-06-15 ENCOUNTER — Encounter: Payer: Self-pay | Admitting: Psychiatry

## 2016-06-15 ENCOUNTER — Ambulatory Visit (INDEPENDENT_AMBULATORY_CARE_PROVIDER_SITE_OTHER): Payer: BC Managed Care – PPO | Admitting: Psychiatry

## 2016-06-15 VITALS — BP 116/78 | HR 79 | Temp 98.4°F | Wt 217.4 lb

## 2016-06-15 DIAGNOSIS — F3160 Bipolar disorder, current episode mixed, unspecified: Secondary | ICD-10-CM | POA: Diagnosis not present

## 2016-06-15 MED ORDER — TRAZODONE HCL 50 MG PO TABS: 50.0000 mg | ORAL_TABLET | Freq: Every day | ORAL | 1 refills | Status: DC

## 2016-06-15 MED ORDER — LURASIDONE HCL 20 MG PO TABS: 30.0000 mg | ORAL_TABLET | Freq: Every morning | ORAL | 1 refills | Status: DC

## 2016-06-15 NOTE — Progress Notes (Signed)
Patient ID: Caprice Mccaffrey, female   DOB: [birth year], 30 y.o.   MRN: [id number]                                                                                                                                      Psychiatric Progress Note   Patient ID: Maryhelen Lindler, female   DOB: [birth year], 30 y.o.   MRN: [id number] Date of Evaluation:  06/15/2016 Referral Source: self referred Chief Complaint:   mood swings Visit Diagnosis: Mixed bipolar disorder  History of Present Illness:  Patient is a 30 year old female off Hispanic origin who presents today for a follow up of  Bipolar disorder. Reports tolerating the Latuda at 30mg  without any side effects. She is sleeping well with the Trazodone. Her anxiety is improved as well. She had a high energy episode this past week, she cleaned the house all day 1 day and then slept all day yesterday. Denies any suicidal thoughts.  Previous Psychotropic Medications: Yes   Substance Abuse History in the last 12 months:  No.  Consequences of Substance Abuse: Negative  Past Medical History:  Past Medical History:  Diagnosis Date  . Arrhythmia   . Bipolar disorder (HCC)   . Depression    No past surgical history on file.  Family Psychiatric History: see below  Family History:  Family History  Problem Relation Age of Onset  . Bipolar disorder Mother   . Drug abuse Mother   . Alcohol abuse Mother   . Obesity Mother   . Skin cancer Mother   . Heart disease Mother   . Cancer - Ovarian Mother   . Prostate cancer Father   . Depression Father   . Drug abuse Father   . Alcohol abuse Father   . Thyroid cancer Sister   . Depression Sister   . Anxiety disorder Sister   . Alcohol abuse Brother   . Drug abuse Brother   . Anxiety disorder Brother     Social History:   Social History   Social History  . Marital status: Single    Spouse name: N/A  . Number of children: N/A  . Years of education: N/A   Social History Main Topics  . Smoking  status: Never Smoker  . Smokeless tobacco: Never Used  . Alcohol use 3.0 oz/week    5 Cans of beer per week     Comment: not at the moment   . Drug use: No  . Sexual activity: Yes    Birth control/ protection: None   Other Topics Concern  . Not on file   Social History Narrative  . No narrative on file    Additional Social History: Patient is currently married and has 11-year-old twins. She also has a 72-year-old son from a previous relationship who spends half time with dad and half time with her.  Allergies:  No Known Allergies  Metabolic Disorder Labs:  No results found for: HGBA1C, MPG No results found for: PROLACTIN Lab Results  Component Value Date   CHOL 147 02/06/2016   TRIG 186 (H) 02/06/2016   HDL 36 (L) 02/06/2016   LDLCALC 74 02/06/2016     Current Medications: Current Outpatient Prescriptions  Medication Sig Dispense Refill  . cetirizine (ZYRTEC) 1 MG/ML syrup Take by mouth daily.    . fluticasone (FLONASE) 50 MCG/ACT nasal spray     . hydrOXYzine (ATARAX/VISTARIL) 10 MG tablet Take 1 tablet (10 mg total) by mouth 3 (three) times daily as needed. 30 tablet 0  . lurasidone (LATUDA) 20 MG TABS tablet Take 1.5 tablets (30 mg total) by mouth every morning. 45 tablet 1  . traZODone (DESYREL) 50 MG tablet Take 1 tablet (50 mg total) by mouth at bedtime. 30 tablet 1   No current facility-administered medications for this visit.     Neurologic: Headache: No Seizure: No Paresthesias:No  Musculoskeletal: Strength & Muscle Tone: within normal limits Gait & Station: normal Patient leans: N/A  Psychiatric Specialty Exam: ROS  There were no vitals taken for this visit.There is no height or weight on file to calculate BMI.  General Appearance: Casual  Eye Contact:  Fair  Speech:  Clear and Coherent  Volume:  Normal  Mood:  improved  Affect:  Congruent  Thought Process:  Coherent  Orientation:  Full (Time, Place, and Person)  Thought Content:  WDL  Suicidal  Thoughts:  No  Homicidal Thoughts:  No  Memory:  Immediate;   Fair Recent;   Fair Remote;   Fair  Judgement:  Fair  Insight:  Fair  Psychomotor Activity:  Normal  Concentration:  Concentration: Fair and Attention Span: Fair  Recall:  [employer]  Fund of Knowledge:Fair  Language: Fair  Akathisia:  No  Handed:  Right  AIMS (if indicated):    Assets:  Communication Skills Desire for Improvement Financial Resources/Insurance Housing Physical Health Resilience Social Support Vocational/Educational  ADL's:  Intact  Cognition: WNL  Sleep:  better    Treatment Plan Summary: Medication management   Bipolar disorder mixed episode currently  Continue Latuda at 30 mg once daily. Patient to monitor for side effects. Continue hydroxyzine at 10mg  1-3 times daily prn for anxiety.  Insomnia Continue trazodone at 50mg  po qhs.  Continue with therapy. Return to clinic in 4 weeks time or call before if necessary.   Patrick NorthAVI, Andelyn Spade, MD 10/2/20173:46 PM

## 2016-07-13 ENCOUNTER — Ambulatory Visit (INDEPENDENT_AMBULATORY_CARE_PROVIDER_SITE_OTHER): Payer: No Typology Code available for payment source | Admitting: Psychiatry

## 2016-07-13 VITALS — BP 110/68 | HR 72 | Ht 66.75 in | Wt 220.0 lb

## 2016-07-13 DIAGNOSIS — F3160 Bipolar disorder, current episode mixed, unspecified: Secondary | ICD-10-CM

## 2016-07-13 MED ORDER — LURASIDONE HCL 20 MG PO TABS: 30.0000 mg | ORAL_TABLET | Freq: Every morning | ORAL | 2 refills | Status: DC

## 2016-07-13 MED ORDER — TRAZODONE HCL 50 MG PO TABS: 50.0000 mg | ORAL_TABLET | Freq: Every day | ORAL | 2 refills | Status: DC

## 2016-07-13 MED ORDER — HYDROXYZINE HCL 10 MG PO TABS: 10.0000 mg | ORAL_TABLET | Freq: Three times a day (TID) | ORAL | 2 refills | Status: DC | PRN

## 2016-07-13 NOTE — Progress Notes (Signed)
Patient ID: Lindsay Brown, female   DOB: [birth year], 30 y.o.   MRN: [id number]                                                                                                                                      Psychiatric Progress Note   Patient ID: Lindsay Brown, female   DOB: [birth year], 30 y.o.   MRN: [id number] Date of Evaluation:  07/13/2016 Referral Source: self referred Chief Complaint:   tired Visit Diagnosis: Mixed bipolar disorder  History of Present Illness:  Patient is a 30 year old female of Hispanic origin who presents today for a follow up of  Bipolar disorder. Reports tolerating the Latuda at 30mg  without any side effects. Reports being very tired this week. She is busy with her children and work. She is sleeping well with the Trazodone. Her anxiety is improved as well. Denies any suicidal thoughts.  Previous Psychotropic Medications: Yes   Substance Abuse History in the last 12 months:  No.  Consequences of Substance Abuse: Negative  Past Medical History:  Past Medical History:  Diagnosis Date  . Arrhythmia   . Bipolar disorder (HCC)   . Depression    No past surgical history on file.  Family Psychiatric History: see below  Family History:  Family History  Problem Relation Age of Onset  . Bipolar disorder Mother   . Drug abuse Mother   . Alcohol abuse Mother   . Obesity Mother   . Skin cancer Mother   . Heart disease Mother   . Cancer - Ovarian Mother   . Prostate cancer Father   . Depression Father   . Drug abuse Father   . Alcohol abuse Father   . Thyroid cancer Sister   . Depression Sister   . Anxiety disorder Sister   . Alcohol abuse Brother   . Drug abuse Brother   . Anxiety disorder Brother     Social History:   Social History   Social History  . Marital status: Single    Spouse name: N/A  . Number of children: N/A  . Years of education: N/A   Social History Main Topics  . Smoking status: Never Smoker  . Smokeless tobacco: Never  Used  . Alcohol use 3.0 oz/week    5 Cans of beer per week     Comment: not at the moment   . Drug use: No  . Sexual activity: Yes    Birth control/ protection: None   Other Topics Concern  . Not on file   Social History Narrative  . No narrative on file    Additional Social History: Patient is currently married and has 411-year-old twins. She also has a 32-year-old son from a previous relationship who spends half time with dad and half time with her.  Allergies:  No Known Allergies  Metabolic Disorder Labs: No results found for: HGBA1C, MPG No results found for:  PROLACTIN Lab Results  Component Value Date   CHOL 147 02/06/2016   TRIG 186 (H) 02/06/2016   HDL 36 (L) 02/06/2016   LDLCALC 74 02/06/2016     Current Medications: Current Outpatient Prescriptions  Medication Sig Dispense Refill  . cetirizine (ZYRTEC) 1 MG/ML syrup Take by mouth daily.    . fluticasone (FLONASE) 50 MCG/ACT nasal spray     . hydrOXYzine (ATARAX/VISTARIL) 10 MG tablet Take 1 tablet (10 mg total) by mouth 3 (three) times daily as needed. 30 tablet 0  . lurasidone (LATUDA) 20 MG TABS tablet Take 1.5 tablets (30 mg total) by mouth every morning. 45 tablet 1  . traZODone (DESYREL) 50 MG tablet Take 1 tablet (50 mg total) by mouth at bedtime. 30 tablet 1   No current facility-administered medications for this visit.     Neurologic: Headache: No Seizure: No Paresthesias:No  Musculoskeletal: Strength & Muscle Tone: within normal limits Gait & Station: normal Patient leans: N/A  Psychiatric Specialty Exam: ROS  There were no vitals taken for this visit.There is no height or weight on file to calculate BMI.  General Appearance: Casual  Eye Contact:  Fair  Speech:  Clear and Coherent  Volume:  Normal  Mood:  improved  Affect:  Congruent  Thought Process:  Coherent  Orientation:  Full (Time, Place, and Person)  Thought Content:  WDL  Suicidal Thoughts:  No  Homicidal Thoughts:  No  Memory:   Immediate;   Fair Recent;   Fair Remote;   Fair  Judgement:  Fair  Insight:  Fair  Psychomotor Activity:  Normal  Concentration:  Concentration: Fair and Attention Span: Fair  Recall:  [employer]  Fund of Knowledge:Fair  Language: Fair  Akathisia:  No  Handed:  Right  AIMS (if indicated):    Assets:  Communication Skills Desire for Improvement Financial Resources/Insurance Housing Physical Health Resilience Social Support Vocational/Educational  ADL's:  Intact  Cognition: WNL  Sleep:  better    Treatment Plan Summary: Medication management   Bipolar disorder mixed episode currently  Continue Latuda at 30 mg once daily. Patient to monitor for side effects. Continue hydroxyzine at 10mg  1-3 times daily prn for anxiety.  Insomnia Continue trazodone at 50mg  po qhs.  Continue with therapy. Return to clinic in 3 months time or call before if necessary.   Patrick NorthAVI, [place], MD 10/30/20173:37 PM

## 2016-08-17 ENCOUNTER — Telehealth: Payer: Self-pay

## 2016-08-17 NOTE — Telephone Encounter (Signed)
left message that it was ok for the 90 day supply with no additional refills.  make sure that they cancel the orginial rx for 30 day with 2 refill.  Pt has appt on  10-13-16 last seen on  07-13-16

## 2016-08-17 NOTE — Telephone Encounter (Signed)
received a fax requesting a 90 day supply for the trazodone 50mg 

## 2016-10-13 ENCOUNTER — Ambulatory Visit: Payer: No Typology Code available for payment source | Admitting: Psychiatry

## 2016-10-19 ENCOUNTER — Telehealth: Payer: Self-pay

## 2016-10-19 NOTE — Telephone Encounter (Signed)
pt called states that she needs refill on her medications.

## 2016-10-19 NOTE — Telephone Encounter (Signed)
left message on doctor line for the latuda, trazodone, hydroxyzine with one additional refill

## 2016-10-22 ENCOUNTER — Encounter: Payer: Self-pay | Admitting: Psychiatry

## 2016-10-22 ENCOUNTER — Ambulatory Visit (INDEPENDENT_AMBULATORY_CARE_PROVIDER_SITE_OTHER): Payer: BC Managed Care – PPO | Admitting: Psychiatry

## 2016-10-22 DIAGNOSIS — F3160 Bipolar disorder, current episode mixed, unspecified: Secondary | ICD-10-CM

## 2016-10-22 MED ORDER — LURASIDONE HCL 20 MG PO TABS: 40.0000 mg | ORAL_TABLET | Freq: Every morning | ORAL | 1 refills | Status: DC

## 2016-10-22 MED ORDER — HYDROXYZINE HCL 10 MG PO TABS: 10.0000 mg | ORAL_TABLET | Freq: Three times a day (TID) | ORAL | 2 refills | Status: DC | PRN

## 2016-10-22 MED ORDER — TRAZODONE HCL 100 MG PO TABS: 50.0000 mg | ORAL_TABLET | Freq: Every day | ORAL | 1 refills | Status: DC

## 2016-10-22 NOTE — Progress Notes (Signed)
Psychiatric Progress Note   Patient ID: Lindsay Brown, female   DOB: [birth year], 31 y.o.   MRN: [id number] Date of Evaluation:  10/22/2016 Referral Source: self referred Chief Complaint:   Chief Complaint    Anxiety; Follow-up; Medication Refill    tired Visit Diagnosis: Mixed bipolar disorder  History of Present Illness:  Patient is a 31 year old female of Hispanic origin who presents today for a follow up of  Bipolar disorder. Patient reports today that she has been extremely stressed. States that she's been having a lot of stress at school with being given children who have a lot of behavioral difficulties to manage. States that she is not getting much support at school. Patient very tearful and states that she is having trouble sleeping. States that she is very sensitive at home and it has been affecting her relationship. States that she is been breaking down in tears multiple times both at home and at school. Denies any suicidal thoughts. Denies any manic symptoms.  Previous Psychotropic Medications: Yes   Substance Abuse History in the last 12 months:  No.  Consequences of Substance Abuse: Negative  Past Medical History:  Past Medical History:  Diagnosis Date  . Arrhythmia   . Bipolar disorder (HCC)   . Depression    No past surgical history on file.  Family Psychiatric History: see below  Family History:  Family History  Problem Relation Age of Onset  . Bipolar disorder Mother   . Drug abuse Mother   . Alcohol abuse Mother   . Obesity Mother   . Skin cancer Mother   . Heart disease Mother   . Cancer - Ovarian Mother   . Prostate cancer Father   . Depression Father   . Drug abuse Father   . Alcohol abuse Father   . Thyroid cancer Sister   . Depression Sister   . Anxiety disorder Sister   . Alcohol abuse Brother   . Drug abuse Brother   . Anxiety disorder Brother     Social  History:   Social History   Social History  . Marital status: Single    Spouse name: N/A  . Number of children: N/A  . Years of education: N/A   Social History Main Topics  . Smoking status: Never Smoker  . Smokeless tobacco: Never Used  . Alcohol use 3.0 oz/week    5 Cans of beer per week     Comment: not at the moment   . Drug use: No  . Sexual activity: Yes    Birth control/ protection: None   Other Topics Concern  . None   Social History Narrative  . None    Additional Social History: Patient is currently married and has 586-year-old twins. She also has a 33-year-old son from a previous relationship who spends half time with dad and half time with her.  Allergies:  No Known Allergies  Metabolic Disorder Labs: No results found for: HGBA1C, MPG No results found for: PROLACTIN Lab Results  Component Value Date   CHOL 147 02/06/2016   TRIG 186 (H) 02/06/2016   HDL 36 (L) 02/06/2016   LDLCALC 74 02/06/2016     Current Medications: Current Outpatient Prescriptions  Medication Sig Dispense Refill  . cetirizine (ZYRTEC) 1 MG/ML syrup Take by mouth daily.    . fluticasone (FLONASE) 50 MCG/ACT nasal spray     . hydrOXYzine (ATARAX/VISTARIL) 10 MG tablet Take 1 tablet (10 mg total) by mouth 3 (three) times daily as needed. 30 tablet 2  . lurasidone (LATUDA) 20 MG TABS tablet Take 1.5 tablets (30 mg total) by mouth every morning. 45 tablet 2  . traZODone (DESYREL) 50 MG tablet Take 1 tablet (50 mg total) by mouth at bedtime. 30 tablet 2   No current facility-administered medications for this visit.     Neurologic: Headache: No Seizure: No Paresthesias:No  Musculoskeletal: Strength & Muscle Tone: within normal limits Gait & Station: normal Patient leans: N/A  Psychiatric Specialty Exam: ROS  Blood pressure 126/72, pulse 91, temperature 98.8 F (37.1 C), temperature source Oral, weight 212 lb (96.2 kg).Body mass index is 33.45 kg/m.  General Appearance: Casual   Eye Contact:  Fair  Speech:  Clear and Coherent  Volume:  Normal  Mood:  depressed  Affect:  tearful  Thought Process:  Coherent  Orientation:  Full (Time, Place, and Person)  Thought Content:  WDL  Suicidal Thoughts:  No  Homicidal Thoughts:  No  Memory:  Immediate;   Fair Recent;   Fair Remote;   Fair  Judgement:  Fair  Insight:  Fair  Psychomotor Activity:  Normal  Concentration:  Concentration: Fair and Attention Span: Fair  Recall:  [employer] of Knowledge:Fair  Language: Fair  Akathisia:  No  Handed:  Right  AIMS (if indicated):    Assets:  Communication Skills Desire for Improvement Financial Resources/Insurance Housing Physical Health Resilience Social Support Vocational/Educational  ADL's:  Intact  Cognition: WNL  Sleep:  disturbed    Treatment Plan Summary: Medication management   Bipolar disorder mixed episode currently Increase Latuda to 40 mg once daily . Patient to monitor for side effects. Continue hydroxyzine at 10mg  1-3 times daily prn for anxiety.  Insomnia Increase trazodone to 100mg  po qhs.  Increase therapy to once per week and discuss coping strategies to deal with the stress at school Return to clinic in 1 weeks  time or call before if necessary.   Patrick North, MD 2/8/20181:53 PM

## 2016-10-26 ENCOUNTER — Telehealth: Payer: Self-pay

## 2016-10-26 NOTE — Telephone Encounter (Signed)
Lindsay Brown HR needs to speak with you.  she needs to asks a couple of questions.  Please call  [phone]

## 2016-10-27 NOTE — Telephone Encounter (Signed)
Thank you :)

## 2016-10-27 NOTE — Telephone Encounter (Signed)
called and spoke wtih Mrs. Lindsay Brown she needed to find out how long you wanted to have patient out of work.  it was not completed. and she wanted to find out how often pt was coming in and you felt like the patient would do better being out completly  So I answered her questions.  I told her that you felt like she needed at least 3 months completely out of work and that she was seeing you weekly and will be reevaluated each visit

## 2016-10-28 ENCOUNTER — Ambulatory Visit (INDEPENDENT_AMBULATORY_CARE_PROVIDER_SITE_OTHER): Payer: BC Managed Care – PPO | Admitting: Psychiatry

## 2016-10-28 ENCOUNTER — Encounter: Payer: Self-pay | Admitting: Psychiatry

## 2016-10-28 VITALS — BP 114/75 | HR 95 | Temp 98.6°F | Wt 211.6 lb

## 2016-10-28 DIAGNOSIS — F3160 Bipolar disorder, current episode mixed, unspecified: Secondary | ICD-10-CM | POA: Diagnosis not present

## 2016-10-28 NOTE — Progress Notes (Signed)
Psychiatric Progress Note   Patient ID: Lindsay Brown, female   DOB: June 15, 1986, 31 y.o.   MRN: [id number] Date of Evaluation:  10/28/2016 Referral Source: self referred Chief Complaint:  Very anxious and difficulty sleeping, depressed Chief Complaint    Follow-up; Medication Refill    tired Visit Diagnosis: Mixed bipolar disorder  History of Present Illness:  Patient is a 31 year old female of Hispanic origin who presents today for a follow up of  Bipolar disorder. Patient has been started on FMLA this week due to her difficulty functioning at school and high levels of stress and anxiety. Given that patient has bipolar disorder, it is important for patient to be able to decompress and not develop a manic episode that could lead to hospitalization. Heart Latuda was increased to 40 mg which patient reports tolerating well. We had increased her trazodone 200 mg which patient reports helps her sleep once she gets to sleep. She reports significant trouble getting to sleep for about 2 hours per night. 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 We discussed that this is not the right treatment for her and she needs to be completely resting and not doing any schoolwork at this time. 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 States that she is quite stressed at home as well and is trying to figure out how to spend her day since she has been working nonstop for the last 10 years. She denies any manic symptoms at this time and denies any suicidal thoughts.  Previous Psychotropic Medications: Yes   Substance Abuse History in the last 12 months:  No.  Consequences of Substance Abuse: Negative  Past Medical History:  Past Medical History:  Diagnosis Date  . Arrhythmia   . Bipolar disorder (HCC)   . Depression     History reviewed. No pertinent surgical history.  Family Psychiatric History: see below  Family History:  Family History  Problem Relation Age of Onset  . Bipolar disorder Mother   . Drug abuse Mother   . Alcohol abuse Mother   . Obesity Mother   . Skin cancer Mother   . Heart disease Mother   . Cancer - Ovarian Mother   . Prostate cancer Father   . Depression Father   . Drug abuse Father   . Alcohol abuse Father   . Thyroid cancer Sister   . Depression Sister   . Anxiety disorder Sister   . Alcohol abuse Brother   . Drug abuse Brother   . Anxiety disorder Brother     Social History:   Social History   Social History  . Marital status: Single    Spouse name: N/A  . Number of children: N/A  . Years of education: N/A   Social History Main Topics  . Smoking status: Never Smoker  . Smokeless tobacco: Never Used  . Alcohol use 3.0 oz/week  5 Cans of beer per week     Comment: not at the moment   . Drug use: No  . Sexual activity: Yes    Birth control/ protection: None   Other Topics Concern  . None   Social History Narrative  . None    Additional Social History: Patient is currently married and has 35-year-old twins. She also has a 15-year-old son from a previous relationship who spends half time with dad and half time with her.  Allergies:  No Known Allergies  Metabolic Disorder Labs: No results found for: HGBA1C, MPG No results found for: PROLACTIN Lab Results  Component Value Date   CHOL 147 02/06/2016   TRIG 186 (H) 02/06/2016   HDL 36 (L) 02/06/2016   LDLCALC 74 02/06/2016     Current Medications: Current Outpatient Prescriptions  Medication Sig Dispense Refill  . cetirizine (ZYRTEC) 1 MG/ML syrup Take by mouth daily.    . fluticasone (FLONASE) 50 MCG/ACT nasal spray     . hydrOXYzine (ATARAX/VISTARIL) 10 MG tablet Take 1 tablet (10 mg total) by mouth 3 (three) times daily as needed. 30 tablet 2  . lurasidone (LATUDA) 20 MG TABS tablet Take 2  tablets (40 mg total) by mouth every morning. 60 tablet 1  . traZODone (DESYREL) 100 MG tablet Take 0.5 tablets (50 mg total) by mouth at bedtime. 30 tablet 1   No current facility-administered medications for this visit.     Neurologic: Headache: No Seizure: No Paresthesias:No  Musculoskeletal: Strength & Muscle Tone: within normal limits Gait & Station: normal Patient leans: N/A  Psychiatric Specialty Exam: ROS  Blood pressure 114/75, pulse 95, temperature 98.6 F (37 C), temperature source Oral, weight 211 lb 9.6 oz (96 kg).Body mass index is 33.39 kg/m.  General Appearance: Casual  Eye Contact:  Fair  Speech:  Clear and Coherent  Volume:  Normal  Mood:  depressed  Affect:  tearful  Thought Process:  Coherent  Orientation:  Full (Time, Place, and Person)  Thought Content:  WDL  Suicidal Thoughts:  No  Homicidal Thoughts:  No  Memory:  Immediate;   Fair Recent;   Fair Remote;   Fair  Judgement:  Fair  Insight:  Fair  Psychomotor Activity:  Normal  Concentration:  Concentration: Fair and Attention Span: Fair  Recall:  [employer] of Knowledge:Fair  Language: Fair  Akathisia:  No  Handed:  Right  AIMS (if indicated):    Assets:  Communication Skills Desire for Improvement Financial Resources/Insurance Housing Physical Health Resilience Social Support Vocational/Educational  ADL's:  Intact  Cognition: WNL  Sleep:  Better, difficulty initiating sleep     Treatment Plan Summary: Medication management   Bipolar disorder mixed episode currently Continue Latuda to 40 mg once daily . Patient to monitor for side effects. Continue hydroxyzine at 10mg  1-3 times daily prn for anxiety. Patient to start yoga and mindfulness classes this week. Discussed several strategies to help patient develop an agenda for the day including the yoga and other exercises. Discussed starting journaling and starting to paint once again to help her decompress and reduce stress  levels.  Insomnia Continue trazodone to 100mg  po qhs.  Increase therapy to once per week and discuss coping strategies to deal with the stress at school Return to clinic in 1 weeks  time or call before if necessary.  Time spent was 45 minutes on this session and more than 50% of this time was spent in counseling and strategizing.  Bettey Muraoka,  MD 2/14/20189:19 AM

## 2016-10-29 ENCOUNTER — Ambulatory Visit: Payer: No Typology Code available for payment source | Admitting: Psychiatry

## 2016-11-04 ENCOUNTER — Encounter: Payer: Self-pay | Admitting: Psychiatry

## 2016-11-04 ENCOUNTER — Ambulatory Visit (INDEPENDENT_AMBULATORY_CARE_PROVIDER_SITE_OTHER): Payer: No Typology Code available for payment source | Admitting: Psychiatry

## 2016-11-04 ENCOUNTER — Ambulatory Visit: Payer: No Typology Code available for payment source | Admitting: Psychiatry

## 2016-11-04 MED ORDER — LURASIDONE HCL 20 MG PO TABS: 60.0000 mg | ORAL_TABLET | Freq: Every morning | ORAL | 1 refills | Status: DC

## 2016-11-04 NOTE — Progress Notes (Signed)
Psychiatric Progress Note   Patient ID: Lindsay Brown, female   DOB: 19-Jun-1986, 31 y.o.   MRN: [id number] Date of Evaluation:  11/04/2016 Referral Source: self referred Chief Complaint:  Very anxious and difficulty sleeping, depressed Chief Complaint    Follow-up; Medication Refill    tired Visit Diagnosis: Mixed bipolar disorder  History of Present Illness:  Patient is a 31 year old female of Hispanic origin who presents today for a follow up of  Bipolar disorder. Patient today reports that she has been doing okay and feels less stressed since she is out of the school. However they are trying to put their house on the market and looking at the larger house and this has been stressful for her. States that she feels she has been hypomanic and has been cleaning the house all the time. States that she continues to have trouble sleeping. States that it looks like she is substituted one stressor for the other. Understands that she has to slow down. She has been going to the gym twice daily. Denies any suicidal thoughts.   Previous Psychotropic Medications: Yes   Substance Abuse History in the last 12 months:  No.  Consequences of Substance Abuse: Negative  Past Medical History:  Past Medical History:  Diagnosis Date  . Arrhythmia   . Bipolar disorder (HCC)   . Depression    History reviewed. No pertinent surgical history.  Family Psychiatric History: see below  Family History:  Family History  Problem Relation Age of Onset  . Bipolar disorder Mother   . Drug abuse Mother   . Alcohol abuse Mother   . Obesity Mother   . Skin cancer Mother   . Heart disease Mother   . Cancer - Ovarian Mother   . Prostate cancer Father   . Depression Father   . Drug abuse Father   . Alcohol abuse Father   . Thyroid cancer Sister   . Depression Sister   . Anxiety disorder Sister   . Alcohol abuse Brother    . Drug abuse Brother   . Anxiety disorder Brother     Social History:   Social History   Social History  . Marital status: Single    Spouse name: N/A  . Number of children: N/A  . Years of education: N/A   Social History Main Topics  . Smoking status: Never Smoker  . Smokeless tobacco: Never Used  . Alcohol use 3.0 oz/week    5 Cans of beer per week     Comment: not at the moment   . Drug use: No  . Sexual activity: Yes    Birth control/ protection: None   Other Topics Concern  . None   Social History Narrative  . None    Additional Social History: Patient is currently married and has 47-year-old twins. She also has a 87-year-old son from a previous relationship who spends half time with dad and half time with her.  Allergies:  No Known Allergies  Metabolic Disorder Labs: No  results found for: HGBA1C, MPG No results found for: PROLACTIN Lab Results  Component Value Date   CHOL 147 02/06/2016   TRIG 186 (H) 02/06/2016   HDL 36 (L) 02/06/2016   LDLCALC 74 02/06/2016     Current Medications: Current Outpatient Prescriptions  Medication Sig Dispense Refill  . cetirizine (ZYRTEC) 1 MG/ML syrup Take by mouth daily.    . fluticasone (FLONASE) 50 MCG/ACT nasal spray     . hydrOXYzine (ATARAX/VISTARIL) 10 MG tablet Take 1 tablet (10 mg total) by mouth 3 (three) times daily as needed. 30 tablet 2  . lurasidone (LATUDA) 20 MG TABS tablet Take 2 tablets (40 mg total) by mouth every morning. 60 tablet 1  . traZODone (DESYREL) 100 MG tablet Take 0.5 tablets (50 mg total) by mouth at bedtime. 30 tablet 1   No current facility-administered medications for this visit.     Neurologic: Headache: No Seizure: No Paresthesias:No  Musculoskeletal: Strength & Muscle Tone: within normal limits Gait & Station: normal Patient leans: N/A  Psychiatric Specialty Exam: ROS  Blood pressure 116/80, pulse 68, temperature 98.5 F (36.9 C), temperature source Oral, weight 208 lb  12.8 oz (94.7 kg).Body mass index is 32.95 kg/m.  General Appearance: Casual  Eye Contact:  Fair  Speech:  Clear and Coherent  Volume:  Normal  Mood:  Slightly euphoric   Affect:  Euthymic   Thought Process:  Coherent  Orientation:  Full (Time, Place, and Person)  Thought Content:  WDL  Suicidal Thoughts:  No  Homicidal Thoughts:  No  Memory:  Immediate;   Fair Recent;   Fair Remote;   Fair  Judgement:  Fair  Insight:  Fair  Psychomotor Activity:  Normal  Concentration:  Concentration: Fair and Attention Span: Fair  Recall:  [employer]  Fund of Knowledge:Fair  Language: Fair  Akathisia:  No  Handed:  Right  AIMS (if indicated):    Assets:  Communication Skills Desire for Improvement Financial Resources/Insurance Housing Physical Health Resilience Social Support Vocational/Educational  ADL's:  Intact  Cognition: WNL  Sleep:  Better, difficulty initiating sleep     Treatment Plan Summary: Medication management   Bipolar disorder mixed episode currently Increase Latuda to 60 mg once daily . Patient to monitor for side effects. Continue hydroxyzine at 10mg  1-3 times daily prn for anxiety. Patient to start yoga and mindfulness classes this week. Insomnia  Continue trazodone to 100mg  po qhs.  Increase therapy to once per week and discuss coping strategies to deal with the stress at school Return to clinic in 2 weeks  time or call before if necessary.  Time spent was 45 minutes on this session and more than 50% of this time was spent in counseling and strategizing.  Patrick NorthAVI, Joclyn [place], MD 2/21/201812:01 PM

## 2016-11-11 ENCOUNTER — Ambulatory Visit (INDEPENDENT_AMBULATORY_CARE_PROVIDER_SITE_OTHER): Payer: No Typology Code available for payment source | Admitting: Psychiatry

## 2016-11-11 ENCOUNTER — Encounter: Payer: Self-pay | Admitting: Psychiatry

## 2016-11-11 DIAGNOSIS — F3189 Other bipolar disorder: Secondary | ICD-10-CM

## 2016-11-11 NOTE — Progress Notes (Signed)
Psychiatric Progress Note   Patient ID: Lindsay Brown, female   DOB: August 31, 1986, 31 y.o.   MRN: [id number] Date of Evaluation:  11/11/2016 Referral Source: self referred Chief Complaint:  Doing much better Chief Complaint    Follow-up; Medication Refill    tired Visit Diagnosis: Mixed bipolar disorder  History of Present Illness:  Patient is a 31 year old female of Hispanic origin who presents today for a follow up of  Bipolar disorder. Patient reports that she has been doing quite well. On the increased dose of the Geodon mood is much improved. She is sleeping much better. States that she's been enjoying playing with her children and doing normal things. States that she is very glad that she is able to stay home and get better. She continues to have anxiety about not having anything to do and trying to figure out what to do the next 2 days. Denies any suicidal thoughts.  Previous Psychotropic Medications: Yes   Substance Abuse History in the last 12 months:  No.  Consequences of Substance Abuse: Negative  Past Medical History:  Past Medical History:  Diagnosis Date  . Arrhythmia   . Bipolar disorder (HCC)   . Depression    History reviewed. No pertinent surgical history.  Family Psychiatric History: see below  Family History:  Family History  Problem Relation Age of Onset  . Bipolar disorder Mother   . Drug abuse Mother   . Alcohol abuse Mother   . Obesity Mother   . Skin cancer Mother   . Heart disease Mother   . Cancer - Ovarian Mother   . Prostate cancer Father   . Depression Father   . Drug abuse Father   . Alcohol abuse Father   . Thyroid cancer Sister   . Depression Sister   . Anxiety disorder Sister   . Alcohol abuse Brother   . Drug abuse Brother   . Anxiety disorder Brother     Social History:   Social History   Social History  . Marital status: Single    Spouse  name: N/A  . Number of children: N/A  . Years of education: N/A   Social History Main Topics  . Smoking status: Never Smoker  . Smokeless tobacco: Never Used  . Alcohol use 3.0 oz/week    5 Cans of beer per week     Comment: not at the moment   . Drug use: No  . Sexual activity: Yes    Birth control/ protection: None   Other Topics Concern  . None   Social History Narrative  . None    Additional Social History: Patient is currently married and has 64-year-old twins. She also has a 37-year-old son from a previous relationship who spends half time with dad and half time with her.  Allergies:  No Known Allergies  Metabolic Disorder Labs: No results found for: HGBA1C, MPG No results found for: PROLACTIN Lab Results  Component Value Date   CHOL 147 02/06/2016   TRIG 186 (  H) 02/06/2016   HDL 36 (L) 02/06/2016   LDLCALC 74 02/06/2016     Current Medications: Current Outpatient Prescriptions  Medication Sig Dispense Refill  . cetirizine (ZYRTEC) 1 MG/ML syrup Take by mouth daily.    . fluticasone (FLONASE) 50 MCG/ACT nasal spray     . hydrOXYzine (ATARAX/VISTARIL) 10 MG tablet Take 1 tablet (10 mg total) by mouth 3 (three) times daily as needed. 30 tablet 2  . lurasidone (LATUDA) 20 MG TABS tablet Take 3 tablets (60 mg total) by mouth every morning. 90 tablet 1  . traZODone (DESYREL) 100 MG tablet Take 0.5 tablets (50 mg total) by mouth at bedtime. 30 tablet 1   No current facility-administered medications for this visit.     Neurologic: Headache: No Seizure: No Paresthesias:No  Musculoskeletal: Strength & Muscle Tone: within normal limits Gait & Station: normal Patient leans: N/A  Psychiatric Specialty Exam: ROS  Blood pressure 122/82, pulse 86, temperature 98.8 F (37.1 C), temperature source Oral, weight 210 lb (95.3 kg).Body mass index is 33.14 kg/m.  General Appearance: Casual  Eye Contact:  Fair  Speech:  Clear and Coherent  Volume:  Normal  Mood:   Better   Affect:  Euthymic   Thought Process:  Coherent  Orientation:  Full (Time, Place, and Person)  Thought Content:  WDL  Suicidal Thoughts:  No  Homicidal Thoughts:  No  Memory:  Immediate;   Fair Recent;   Fair Remote;   Fair  Judgement:  Fair  Insight:  Fair  Psychomotor Activity:  Normal  Concentration:  Concentration: Fair and Attention Span: Fair  Recall:  [employer]  Fund of Knowledge:Fair  Language: Fair  Akathisia:  No  Handed:  Right  AIMS (if indicated):    Assets:  Communication Skills Desire for Improvement Financial Resources/Insurance Housing Physical Health Resilience Social Support Vocational/Educational  ADL's:  Intact  Cognition: WNL  Sleep:  Better     Treatment Plan Summary: Medication management   Bipolar disorder mixed episode currently Latuda at 60 mg once daily . Patient to monitor for side effects. Continue hydroxyzine at 10mg  1-3 times daily prn for anxiety. Patient to start yoga and mindfulness classes this week. Insomnia  Continue trazodone to 100mg  po qhs.  Continue therapy to develop coping mechanisms to deal with stress Return to clinic in 2 weeks  time or call before if necessary.  Time spent was 45 minutes on this session and more than 50% of this time was spent in counseling and strategizing.  Patrick NorthAVI, Nel Stoneking, MD 2/28/201812:47 PM

## 2016-11-18 ENCOUNTER — Ambulatory Visit: Payer: No Typology Code available for payment source | Admitting: Psychiatry

## 2016-11-26 ENCOUNTER — Encounter: Payer: Self-pay | Admitting: Psychiatry

## 2016-11-26 ENCOUNTER — Ambulatory Visit (INDEPENDENT_AMBULATORY_CARE_PROVIDER_SITE_OTHER): Payer: BC Managed Care – PPO | Admitting: Psychiatry

## 2016-11-26 VITALS — BP 112/75 | HR 71 | Temp 98.7°F | Wt 207.8 lb

## 2016-11-26 DIAGNOSIS — F3160 Bipolar disorder, current episode mixed, unspecified: Secondary | ICD-10-CM | POA: Diagnosis not present

## 2016-11-26 NOTE — Progress Notes (Signed)
Psychiatric Progress Note   Patient ID: Lindsay Brown, female   DOB: May 24, 1986, 31 y.o.   MRN: [id number]   Date of Evaluation:  11/26/2016 Referral Source: self referred Chief Complaint:  Doing much better  Visit Diagnosis: Mixed bipolar disorder  History of Present Illness:  Patient is a 31 year old female of Hispanic origin who presents today for a follow up of  Bipolar disorder. Patient reports that she has been doing quite well. Denies any side effects on the latuda. She has been relaxing and is able to manage her time well. Fair sleep and appetite. Her house was sold and feeling less stressed, their new home is getting ready.  Denies any manic symptoms and suicidal thoughts.  Previous Psychotropic Medications: Yes   Substance Abuse History in the last 12 months:  No.  Consequences of Substance Abuse: Negative  Past Medical History:  Past Medical History:  Diagnosis Date  . Arrhythmia   . Bipolar disorder (HCC)   . Depression    No past surgical history on file.  Family Psychiatric History: see below  Family History:  Family History  Problem Relation Age of Onset  . Bipolar disorder Mother   . Drug abuse Mother   . Alcohol abuse Mother   . Obesity Mother   . Skin cancer Mother   . Heart disease Mother   . Cancer - Ovarian Mother   . Prostate cancer Father   . Depression Father   . Drug abuse Father   . Alcohol abuse Father   . Thyroid cancer Sister   . Depression Sister   . Anxiety disorder Sister   . Alcohol abuse Brother   . Drug abuse Brother   . Anxiety disorder Brother     Social History:   Social History   Social History  . Marital status: Single    Spouse name: N/A  . Number of children: N/A  . Years of education: N/A   Social History Main Topics  . Smoking status: Never Smoker  . Smokeless tobacco: Never Used  . Alcohol use 3.0 oz/week    5 Cans of beer  per week     Comment: not at the moment   . Drug use: No  . Sexual activity: Yes    Birth control/ protection: None   Other Topics Concern  . Not on file   Social History Narrative  . No narrative on file    Additional Social History: Patient is currently married and has 31-year-old twins. She also has a 26-year-old son from a previous relationship who spends half time with dad and half time with her.  Allergies:  No Known Allergies  Metabolic Disorder Labs: No results found for: HGBA1C, MPG No results found for: PROLACTIN Lab Results  Component Value Date   CHOL 147 02/06/2016   TRIG 186 (H) 02/06/2016   HDL 36 (L) 02/06/2016   LDLCALC 74 02/06/2016     Current Medications: Current Outpatient Prescriptions  Medication Sig Dispense Refill  . cetirizine (ZYRTEC)  1 MG/ML syrup Take by mouth daily.    . fluticasone (FLONASE) 50 MCG/ACT nasal spray     . hydrOXYzine (ATARAX/VISTARIL) 10 MG tablet Take 1 tablet (10 mg total) by mouth 3 (three) times daily as needed. 30 tablet 2  . lurasidone (LATUDA) 20 MG TABS tablet Take 3 tablets (60 mg total) by mouth every morning. 90 tablet 1  . traZODone (DESYREL) 100 MG tablet Take 0.5 tablets (50 mg total) by mouth at bedtime. 30 tablet 1   No current facility-administered medications for this visit.     Neurologic: Headache: No Seizure: No Paresthesias:No  Musculoskeletal: Strength & Muscle Tone: within normal limits Gait & Station: normal Patient leans: N/A  Psychiatric Specialty Exam: ROS  There were no vitals taken for this visit.There is no height or weight on file to calculate BMI.  General Appearance: Casual  Eye Contact:  Fair  Speech:  Clear and Coherent  Volume:  Normal  Mood:  Better   Affect:  Euthymic   Thought Process:  Coherent  Orientation:  Full (Time, Place, and Person)  Thought Content:  WDL  Suicidal Thoughts:  No  Homicidal Thoughts:  No  Memory:  Immediate;   Fair Recent;   Fair Remote;   Fair   Judgement:  Fair  Insight:  Fair  Psychomotor Activity:  Normal  Concentration:  Concentration: Fair and Attention Span: Fair  Recall:  [employer]  Fund of Knowledge:Fair  Language: Fair  Akathisia:  No  Handed:  Right  AIMS (if indicated):    Assets:  Communication Skills Desire for Improvement Financial Resources/Insurance Housing Physical Health Resilience Social Support Vocational/Educational  ADL's:  Intact  Cognition: WNL  Sleep:  Better     Treatment Plan Summary: Medication management   Bipolar disorder mixed episode currently Latuda at 60 mg once daily . Patient to monitor for side effects. Continue hydroxyzine at 10mg  1-3 times daily prn for anxiety. Continue yoga and mindfulness classes this week.  Insomnia  Continue trazodone to 100mg  po qhs.  Continue therapy to develop coping mechanisms to deal with stress Return to clinic in 4 weeks  time or call before if necessary.  Time spent was 30 minutes on this session and more than 50% of this time was spent in counseling and strategizing.  Patrick NorthAVI, Loni Abdon, MD 3/15/20183:49 PM

## 2016-12-29 ENCOUNTER — Encounter: Payer: Self-pay | Admitting: Psychiatry

## 2016-12-29 ENCOUNTER — Ambulatory Visit (INDEPENDENT_AMBULATORY_CARE_PROVIDER_SITE_OTHER): Payer: BC Managed Care – PPO | Admitting: Psychiatry

## 2016-12-29 DIAGNOSIS — F3160 Bipolar disorder, current episode mixed, unspecified: Secondary | ICD-10-CM

## 2016-12-29 MED ORDER — TRAZODONE HCL 100 MG PO TABS: 50.0000 mg | ORAL_TABLET | Freq: Every day | ORAL | 1 refills | Status: DC

## 2016-12-29 MED ORDER — LURASIDONE HCL 20 MG PO TABS: 60.0000 mg | ORAL_TABLET | Freq: Every morning | ORAL | 1 refills | Status: DC

## 2016-12-29 NOTE — Progress Notes (Signed)
Psychiatric Progress Note   Patient ID: Lindsay Brown, female   DOB: Aug 25, 1986, 31 y.o.   MRN: [id number]   Date of Evaluation:  12/29/2016 Referral Source: self referred Chief Complaint:  Doing much better Chief Complaint    Follow-up; Medication Refill     Visit Diagnosis: Mixed bipolar disorder  History of Present Illness:  Patient is a 31 year old female of Hispanic origin who presents today for a follow up of  Bipolar disorder. Patient reports that she has been doing quite well. States that she is compliant with her medications and denies any side effects. Denies any manic symptoms. They're getting ready to move to the new house and she is busy packing. Things are going well with her husband. She is engaged actively in therapy and learning her own triggers for anxiety. Denies any manic symptoms and suicidal thoughts.  Previous Psychotropic Medications: Yes   Substance Abuse History in the last 12 months:  No.  Consequences of Substance Abuse: Negative  Past Medical History:  Past Medical History:  Diagnosis Date  . Arrhythmia   . Bipolar disorder (HCC)   . Depression    History reviewed. No pertinent surgical history.  Family Psychiatric History: see below  Family History:  Family History  Problem Relation Age of Onset  . Bipolar disorder Mother   . Drug abuse Mother   . Alcohol abuse Mother   . Obesity Mother   . Skin cancer Mother   . Heart disease Mother   . Cancer - Ovarian Mother   . Prostate cancer Father   . Depression Father   . Drug abuse Father   . Alcohol abuse Father   . Thyroid cancer Sister   . Depression Sister   . Anxiety disorder Sister   . Alcohol abuse Brother   . Drug abuse Brother   . Anxiety disorder Brother     Social History:   Social History   Social History  . Marital status: Single    Spouse name: N/A  . Number of children: N/A  . Years  of education: N/A   Social History Main Topics  . Smoking status: Never Smoker  . Smokeless tobacco: Never Used  . Alcohol use 3.0 oz/week    5 Cans of beer per week     Comment: not at the moment   . Drug use: No  . Sexual activity: Yes    Birth control/ protection: None   Other Topics Concern  . None   Social History Narrative  . None    Additional Social History: Patient is currently married and has 75-year-old twins. She also has a 32-year-old son from a previous relationship who spends half time with dad and half time with her.  Allergies:  No Known Allergies  Metabolic Disorder Labs: No results found for: HGBA1C, MPG No results found for: PROLACTIN Lab Results  Component Value Date   CHOL 147 02/06/2016   TRIG 186 (H) 02/06/2016   HDL 36 (L) 02/06/2016   LDLCALC 74  02/06/2016     Current Medications: Current Outpatient Prescriptions  Medication Sig Dispense Refill  . cetirizine (ZYRTEC) 1 MG/ML syrup Take by mouth daily.    . fluticasone (FLONASE) 50 MCG/ACT nasal spray     . hydrOXYzine (ATARAX/VISTARIL) 10 MG tablet Take 1 tablet (10 mg total) by mouth 3 (three) times daily as needed. 30 tablet 2  . lurasidone (LATUDA) 20 MG TABS tablet Take 3 tablets (60 mg total) by mouth every morning. 90 tablet 1  . traZODone (DESYREL) 100 MG tablet Take 0.5 tablets (50 mg total) by mouth at bedtime. 30 tablet 1   No current facility-administered medications for this visit.     Neurologic: Headache: No Seizure: No Paresthesias:No  Musculoskeletal: Strength & Muscle Tone: within normal limits Gait & Station: normal Patient leans: N/A  Psychiatric Specialty Exam: ROS  Blood pressure 138/82, pulse 87, temperature 98.4 F (36.9 C), temperature source Oral, weight 210 lb 9.6 oz (95.5 kg).Body mass index is 33.23 kg/m.  General Appearance: Casual  Eye Contact:  Fair  Speech:  Clear and Coherent  Volume:  Normal  Mood:  Better   Affect:  Euthymic   Thought  Process:  Coherent  Orientation:  Full (Time, Place, and Person)  Thought Content:  WDL  Suicidal Thoughts:  No  Homicidal Thoughts:  No  Memory:  Immediate;   Fair Recent;   Fair Remote;   Fair  Judgement:  Fair  Insight:  Fair  Psychomotor Activity:  Normal  Concentration:  Concentration: Fair and Attention Span: Fair  Recall:  [employer] of Knowledge:Fair  Language: Fair  Akathisia:  No  Handed:  Right  AIMS (if indicated):    Assets:  Communication Skills Desire for Improvement Financial Resources/Insurance Housing Physical Health Resilience Social Support Vocational/Educational  ADL's:  Intact  Cognition: WNL  Sleep:  Better     Treatment Plan Summary: Medication management   Bipolar disorder mixed episode currently Latuda at 60 mg once daily . Patient to monitor for side effects. Continue hydroxyzine at  1-3 times daily prn for anxiety Try to increase exercise and mindfulness. Continue therapy  Insomnia  Continue trazodone to  po qhs.  Continue therapy to develop coping mechanisms to deal with stress Return to clinic in 4 weeks  time or call before if necessary.  Time spent was 30 minutes on this session and more than 50% of this time was spent in counseling and strategizing.  Patrick North, MD 4/17/20183:55 PM

## 2017-01-15 ENCOUNTER — Telehealth: Payer: Self-pay

## 2017-01-15 NOTE — Telephone Encounter (Signed)
pt called states she needs a letter stating that it is ok for her to return to work.  pt statews she needs letter by next friday  01-22-17  and she goes back to work on  01-25-17

## 2017-01-18 NOTE — Telephone Encounter (Signed)
Can you please type up a letter saying that patient has recovered sufficiently and is okay to go back to work, thank you

## 2017-01-20 NOTE — Telephone Encounter (Signed)
Letter is in your chair to sign off

## 2017-02-10 ENCOUNTER — Ambulatory Visit (INDEPENDENT_AMBULATORY_CARE_PROVIDER_SITE_OTHER): Payer: BC Managed Care – PPO | Admitting: Psychiatry

## 2017-02-10 ENCOUNTER — Encounter: Payer: Self-pay | Admitting: Psychiatry

## 2017-02-10 VITALS — BP 133/83 | HR 73 | Temp 98.4°F | Wt 210.6 lb

## 2017-02-10 DIAGNOSIS — F3160 Bipolar disorder, current episode mixed, unspecified: Secondary | ICD-10-CM

## 2017-02-10 MED ORDER — TRAZODONE HCL 100 MG PO TABS: 100.0000 mg | ORAL_TABLET | Freq: Every day | ORAL | 1 refills | Status: DC

## 2017-02-10 MED ORDER — HYDROXYZINE HCL 10 MG PO TABS: 10.0000 mg | ORAL_TABLET | Freq: Three times a day (TID) | ORAL | 2 refills | Status: DC | PRN

## 2017-02-10 NOTE — Progress Notes (Signed)
Psychiatric Progress Note   Patient ID: Lindsay Brown, female   DOB: 07/07/1986, 31 y.o.   MRN: [id number]   Date of Evaluation:  02/10/2017 Referral Source: self referred Chief Complaint:  Doing ok since starting work Chief Complaint    Follow-up; Medication Refill     Visit Diagnosis: Mixed bipolar disorder  History of Present Illness:  Patient is a 31 year old female of Hispanic origin who presents today for a follow up of  Bipolar disorder. Patient reports that she went back to work 2 weeks ago and was told to take other classes than she did previously. 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 Patient reports being stressed daily. She has started to look for other jobs and has some good leads. Fair sleep and appetite.  Denies any manic symptoms and suicidal thoughts.  Previous Psychotropic Medications: Yes   Substance Abuse History in the last 12 months:  No.  Consequences of Substance Abuse: Negative  Past Medical History:  Past Medical History:  Diagnosis Date  . Arrhythmia   . Bipolar disorder (HCC)   . Depression    History reviewed. No pertinent surgical history.  Family Psychiatric History: see below  Family History:  Family History  Problem Relation Age of Onset  . Bipolar disorder Mother   . Drug abuse Mother   . Alcohol abuse Mother   . Obesity Mother   . Skin cancer Mother   . Heart disease Mother   . Cancer - Ovarian Mother   . Prostate cancer Father   . Depression Father   . Drug abuse Father   . Alcohol abuse Father   . Thyroid cancer Sister   . Depression Sister   . Anxiety disorder Sister   . Alcohol abuse Brother   . Drug abuse Brother   . Anxiety disorder Brother     Social History:   Social History   Social History  . Marital status: Single    Spouse name: N/A  . Number of children: N/A  . Years of education: N/A    Social History Main Topics  . Smoking status: Never Smoker  . Smokeless tobacco: Never Used  . Alcohol use 3.0 oz/week    5 Cans of beer per week     Comment: not at the moment   . Drug use: No  . Sexual activity: Yes    Birth control/ protection: None   Other Topics Concern  . None   Social History Narrative  . None    Additional Social History: Patient is currently married and has 33-year-old twins. She also has a 2-year-old son from a previous relationship who spends half time with dad and half time with her.  Allergies:  No Known Allergies  Metabolic Disorder Labs: No results found for: HGBA1C, MPG No results found for: PROLACTIN Lab Results  Component Value Date   CHOL 147 02/06/2016   TRIG 186 (H) 02/06/2016   HDL 36 (L) 02/06/2016   LDLCALC  74 02/06/2016     Current Medications: Current Outpatient Prescriptions  Medication Sig Dispense Refill  . cetirizine (ZYRTEC) 1 MG/ML syrup Take by mouth daily.    . fluticasone (FLONASE) 50 MCG/ACT nasal spray     . hydrOXYzine (ATARAX/VISTARIL) 10 MG tablet Take 1 tablet (10 mg total) by mouth 3 (three) times daily as needed. 30 tablet 2  . lurasidone (LATUDA) 20 MG TABS tablet Take 3 tablets (60 mg total) by mouth every morning. 90 tablet 1  . traZODone (DESYREL) 100 MG tablet Take 1 tablet (100 mg total) by mouth at bedtime. 30 tablet 1   No current facility-administered medications for this visit.     Neurologic: Headache: No Seizure: No Paresthesias:No  Musculoskeletal: Strength & Muscle Tone: within normal limits Gait & Station: normal Patient leans: N/A  Psychiatric Specialty Exam: ROS  There were no vitals taken for this visit.There is no height or weight on file to calculate BMI.  General Appearance: Casual  Eye Contact:  Fair  Speech:  Clear and Coherent  Volume:  Normal  Mood:  Better   Affect:  Euthymic   Thought Process:  Coherent  Orientation:  Full (Time, Place, and Person)  Thought  Content:  WDL  Suicidal Thoughts:  No  Homicidal Thoughts:  No  Memory:  Immediate;   Fair Recent;   Fair Remote;   Fair  Judgement:  Fair  Insight:  Fair  Psychomotor Activity:  Normal  Concentration:  Concentration: Fair and Attention Span: Fair  Recall:  [employer]  Fund of Knowledge:Fair  Language: Fair  Akathisia:  No  Handed:  Right  AIMS (if indicated):    Assets:  Communication Skills Desire for Improvement Financial Resources/Insurance Housing Physical Health Resilience Social Support Vocational/Educational  ADL's:  Intact  Cognition: WNL  Sleep:  Better     Treatment Plan Summary: Medication management   Bipolar disorder mixed episode currently Latuda at 60 mg once daily . Patient to monitor for side effects. Continue hydroxyzine at 10mg  1-3 times daily prn for anxiety Try to increase exercise and mindfulness. Continue therapy  Insomnia  Continue trazodone to 100mg  po qhs.  Continue therapy to develop coping mechanisms to deal with stress Return to clinic in 2 weeks  time or call before if necessary.  Time spent was 30 minutes on this session and more than 50% of this time was spent in counseling and strategizing.  Patrick NorthAVI, Lindsay Amaro, MD 5/30/20184:04 PM

## 2017-02-25 ENCOUNTER — Ambulatory Visit: Payer: BC Managed Care – PPO | Admitting: Psychiatry

## 2017-03-09 ENCOUNTER — Encounter: Payer: Self-pay | Admitting: Psychiatry

## 2017-03-09 ENCOUNTER — Ambulatory Visit (INDEPENDENT_AMBULATORY_CARE_PROVIDER_SITE_OTHER): Payer: BC Managed Care – PPO | Admitting: Psychiatry

## 2017-03-09 VITALS — BP 117/78 | HR 68 | Temp 98.8°F | Wt 210.4 lb

## 2017-03-09 DIAGNOSIS — F3160 Bipolar disorder, current episode mixed, unspecified: Secondary | ICD-10-CM | POA: Diagnosis not present

## 2017-03-09 MED ORDER — TRAZODONE HCL 100 MG PO TABS: 100.0000 mg | ORAL_TABLET | Freq: Every day | ORAL | 1 refills | Status: DC

## 2017-03-09 MED ORDER — HYDROXYZINE HCL 10 MG PO TABS
10.0000 mg | ORAL_TABLET | Freq: Three times a day (TID) | ORAL | 2 refills | Status: DC | PRN
Start: 2017-03-09 — End: 2017-05-06

## 2017-03-09 MED ORDER — LURASIDONE HCL 20 MG PO TABS: 60.0000 mg | ORAL_TABLET | Freq: Every morning | ORAL | 1 refills | Status: DC

## 2017-03-09 NOTE — Progress Notes (Signed)
Psychiatric Progress Note   Patient ID: Lindsay Brown, female   DOB: 11-Dec-1985, 31 y.o.   MRN: [id number]   Date of Evaluation:  03/09/2017 Referral Source: self referred Chief Complaint:  Doing ok since starting work Chief Complaint    Follow-up; Medication Refill     Visit Diagnosis: Mixed bipolar disorder  History of Present Illness:  Patient is a 31 year old female of Hispanic origin who presents today for a follow up of  Bipolar disorder. She ran out of the Jordan[place]uda for 4 days and had some hypomania. States she used this time to organizes her home. She had some trouble sleeping when she was not on the latuda. She is back to normal. Fair sleep and appetite.  Denies any current manic symptoms and suicidal thoughts.  Previous Psychotropic Medications: Yes   Substance Abuse History in the last 12 months:  No.  Consequences of Substance Abuse: Negative  Past Medical History:  Past Medical History:  Diagnosis Date  . Arrhythmia   . Bipolar disorder (HCC)   . Depression    History reviewed. No pertinent surgical history.  Family Psychiatric History: see below  Family History:  Family History  Problem Relation Age of Onset  . Bipolar disorder Mother   . Drug abuse Mother   . Alcohol abuse Mother   . Obesity Mother   . Skin cancer Mother   . Heart disease Mother   . Cancer - Ovarian Mother   . Prostate cancer Father   . Depression Father   . Drug abuse Father   . Alcohol abuse Father   . Thyroid cancer Sister   . Depression Sister   . Anxiety disorder Sister   . Alcohol abuse Brother   . Drug abuse Brother   . Anxiety disorder Brother     Social History:   Social History   Social History  . Marital status: Single    Spouse name: N/A  . Number of children: N/A  . Years of education: N/A   Social History Main Topics  . Smoking status: Never Smoker  . Smokeless tobacco:  Never Used  . Alcohol use 3.0 oz/week    5 Cans of beer per week     Comment: not at the moment   . Drug use: No  . Sexual activity: Yes    Birth control/ protection: None   Other Topics Concern  . None   Social History Narrative  . None    Additional Social History: Patient is currently married and has 33-year-old twins. She also has a 33-year-old son from a previous relationship who spends half time with dad and half time with her.  Allergies:  No Known Allergies  Metabolic Disorder Labs: No results found for: HGBA1C, MPG No results found for: PROLACTIN Lab Results  Component Value Date   CHOL 147 02/06/2016   TRIG 186 (H) 02/06/2016   HDL 36 (L) 02/06/2016   LDLCALC 74 02/06/2016     Current Medications: Current Outpatient Prescriptions  Medication Sig  Dispense Refill  . cetirizine (ZYRTEC) 1 MG/ML syrup Take by mouth daily.    . fluticasone (FLONASE) 50 MCG/ACT nasal spray     . hydrOXYzine (ATARAX/VISTARIL) 10 MG tablet Take 1 tablet (10 mg total) by mouth 3 (three) times daily as needed. 30 tablet 2  . lurasidone (LATUDA) 20 MG TABS tablet Take 3 tablets (60 mg total) by mouth every morning. 90 tablet 1  . traZODone (DESYREL) 100 MG tablet Take 1 tablet (100 mg total) by mouth at bedtime. 30 tablet 1   No current facility-administered medications for this visit.     Neurologic: Headache: No Seizure: No Paresthesias:No  Musculoskeletal: Strength & Muscle Tone: within normal limits Gait & Station: normal Patient leans: N/A  Psychiatric Specialty Exam: ROS  Blood pressure 117/78, pulse 68, temperature 98.8 F (37.1 C), temperature source Oral, weight 210 lb 6.4 oz (95.4 kg).Body mass index is 33.2 kg/m.  General Appearance: Casual  Eye Contact:  Fair  Speech:  Clear and Coherent  Volume:  Normal  Mood:  Better   Affect:  Euthymic   Thought Process:  Coherent  Orientation:  Full (Time, Place, and Person)  Thought Content:  WDL  Suicidal Thoughts:  No   Homicidal Thoughts:  No  Memory:  Immediate;   Fair Recent;   Fair Remote;   Fair  Judgement:  Fair  Insight:  Fair  Psychomotor Activity:  Normal  Concentration:  Concentration: Fair and Attention Span: Fair  Recall:  [employer] of Knowledge:Fair  Language: Fair  Akathisia:  No  Handed:  Right  AIMS (if indicated):    Assets:  Communication Skills Desire for Improvement Financial Resources/Insurance Housing Physical Health Resilience Social Support Vocational/Educational  ADL's:  Intact  Cognition: WNL  Sleep:  Better     Treatment Plan Summary: Medication management   Bipolar disorder mixed episode currently Latuda at 60 mg once daily . Patient to monitor for side effects. Continue hydroxyzine at 10mg  1-3 times daily prn for anxiety Continue therapy  Insomnia  Continue trazodone to 100mg  po qhs.  Continue therapy to develop coping mechanisms to deal with stress Return to clinic in 2 months  time or call before if necessary.  Time spent was 30 minutes on this session and more than 50% of this time was spent in counseling and strategizing.  Patrick North, MD 6/26/201811:13 AM

## 2017-03-23 ENCOUNTER — Ambulatory Visit (INDEPENDENT_AMBULATORY_CARE_PROVIDER_SITE_OTHER): Payer: BC Managed Care – PPO | Admitting: Psychiatry

## 2017-03-23 ENCOUNTER — Encounter: Payer: Self-pay | Admitting: Psychiatry

## 2017-03-23 VITALS — BP 131/82 | HR 65 | Temp 98.4°F | Wt 210.2 lb

## 2017-03-23 DIAGNOSIS — F3160 Bipolar disorder, current episode mixed, unspecified: Secondary | ICD-10-CM | POA: Diagnosis not present

## 2017-03-23 MED ORDER — LURASIDONE HCL 20 MG PO TABS: 80.0000 mg | ORAL_TABLET | Freq: Every morning | ORAL | 1 refills | Status: DC

## 2017-03-23 NOTE — Progress Notes (Signed)
Psychiatric Progress Note   Patient ID: Lindsay Brown, female   DOB: 1985-11-26, 31 y.o.   MRN: [id number]   Date of Evaluation:  03/23/2017 Referral Source: self referred Chief Complaint:  Sleeping too much, now high energy Chief Complaint    Follow-up; Medication Refill; Medication Problem; Insomnia     Visit Diagnosis: Mixed bipolar disorder  History of Present Illness:  Patient is a 31 year old female of Hispanic origin who presents today for a follow up of  Bipolar disorder. She asked for an earlier appointment, today she reports that for the last week she has been sleeping too much and feeling like she can't get out of bed. There were days when she did not shower for about 4 days. However she then stopped taking the trazodone. States in the last 2 days her energy so high that she has been organizing the garage into subgroups. Patient also somewhat rapidly and with high volume. Does agree that she seems to be cycling up and down. Denies any suicidal thoughts. We discussed increasing the Latuda to 80 mg daily.  Previous Psychotropic Medications: Yes   Substance Abuse History in the last 12 months:  No.  Consequences of Substance Abuse: Negative  Past Medical History:  Past Medical History:  Diagnosis Date  . Arrhythmia   . Bipolar disorder (HCC)   . Depression    History reviewed. No pertinent surgical history.  Family Psychiatric History: see below  Family History:  Family History  Problem Relation Age of Onset  . Bipolar disorder Mother   . Drug abuse Mother   . Alcohol abuse Mother   . Obesity Mother   . Skin cancer Mother   . Heart disease Mother   . Cancer - Ovarian Mother   . Prostate cancer Father   . Depression Father   . Drug abuse Father   . Alcohol abuse Father   . Thyroid cancer Sister   . Depression Sister   . Anxiety disorder Sister   . Alcohol abuse Brother   .  Drug abuse Brother   . Anxiety disorder Brother     Social History:   Social History   Social History  . Marital status: Single    Spouse name: N/A  . Number of children: N/A  . Years of education: N/A   Social History Main Topics  . Smoking status: Never Smoker  . Smokeless tobacco: Never Used  . Alcohol use 3.0 oz/week    5 Cans of beer per week     Comment: not at the moment   . Drug use: No  . Sexual activity: Yes    Birth control/ protection: None   Other Topics Concern  . None   Social History Narrative  . None    Additional Social History: Patient is currently married and has 33-year-old twins. She also has a 33-year-old son from a previous relationship who spends half time with dad and half time with her.  Allergies:  No Known Allergies  Metabolic Disorder Labs:  No results found for: HGBA1C, MPG No results found for: PROLACTIN Lab Results  Component Value Date   CHOL 147 02/06/2016   TRIG 186 (H) 02/06/2016   HDL 36 (L) 02/06/2016   LDLCALC 74 02/06/2016     Current Medications: Current Outpatient Prescriptions  Medication Sig Dispense Refill  . cetirizine (ZYRTEC) 1 MG/ML syrup Take by mouth daily.    . fluticasone (FLONASE) 50 MCG/ACT nasal spray     . hydrOXYzine (ATARAX/VISTARIL) 10 MG tablet Take 1 tablet (10 mg total) by mouth 3 (three) times daily as needed. 30 tablet 2  . lurasidone (LATUDA) 20 MG TABS tablet Take 3 tablets (60 mg total) by mouth every morning. 90 tablet 1  . traZODone (DESYREL) 100 MG tablet Take 1 tablet (100 mg total) by mouth at bedtime. (Patient not taking: Reported on 03/23/2017) 30 tablet 1   No current facility-administered medications for this visit.     Neurologic: Headache: No Seizure: No Paresthesias:No  Musculoskeletal: Strength & Muscle Tone: within normal limits Gait & Station: normal Patient leans: N/A  Psychiatric Specialty Exam: ROS  Blood pressure 131/82, pulse 65, temperature 98.4 F (36.9 C),  temperature source Oral, weight 210 lb 3.2 oz (95.3 kg).Body mass index is 33.17 kg/m.  General Appearance: Casual  Eye Contact:  Fair  Speech:  Clear and Coherent, slightly pressured  Volume:  high  Mood:  Up and down  Affect:  euphoric  Thought Process:  Coherent  Orientation:  Full (Time, Place, and Person)  Thought Content:  WDL  Suicidal Thoughts:  No  Homicidal Thoughts:  No  Memory:  Immediate;   Fair Recent;   Fair Remote;   Fair  Judgement:  Fair  Insight:  Fair  Psychomotor Activity:  Normal  Concentration:  Concentration: Fair and Attention Span: Fair  Recall:  [employer] of Knowledge:Fair  Language: Fair  Akathisia:  No  Handed:  Right  AIMS (if indicated):    Assets:  Communication Skills Desire for Improvement Financial Resources/Insurance Housing Physical Health Resilience Social Support Vocational/Educational  ADL's:  Intact  Cognition: WNL  Sleep:  Up and down     Treatment Plan Summary: Medication management   Bipolar disorder mixed episode currently Increase Latuda to 80 mg once daily . Patient to monitor for side effects. Continue hydroxyzine at 10mg  1-3 times daily prn for anxiety Continue therapy  Insomnia  Continue trazodone to 100mg  po qhs.  Continue therapy to develop coping mechanisms to deal with stress Return to clinic in 2 weeks  time or call before if necessary.  Time spent was 30 minutes on this session and more than 50% of this time was spent in counseling and strategizing.  Patrick North, MD 7/10/20189:38 AM

## 2017-04-15 ENCOUNTER — Ambulatory Visit: Payer: BC Managed Care – PPO | Admitting: Psychiatry

## 2017-04-20 ENCOUNTER — Ambulatory Visit (INDEPENDENT_AMBULATORY_CARE_PROVIDER_SITE_OTHER): Payer: BC Managed Care – PPO | Admitting: Psychiatry

## 2017-04-20 ENCOUNTER — Encounter: Payer: Self-pay | Admitting: Psychiatry

## 2017-04-20 VITALS — BP 135/86 | HR 88 | Temp 99.3°F | Wt 208.8 lb

## 2017-04-20 DIAGNOSIS — F3160 Bipolar disorder, current episode mixed, unspecified: Secondary | ICD-10-CM | POA: Diagnosis not present

## 2017-04-20 MED ORDER — TRAZODONE HCL 100 MG PO TABS: 100.0000 mg | ORAL_TABLET | Freq: Every day | ORAL | 1 refills | Status: DC

## 2017-04-20 NOTE — Progress Notes (Signed)
Psychiatric Progress Note   Patient ID: Lindsay Brown, female   DOB: 1986/04/14, 31 y.o.   MRN: [id number]   Date of Evaluation:  04/20/2017 Referral Source: self referred Chief Complaint:  Doing better  Visit Diagnosis: Mixed bipolar disorder  History of Present Illness:  Patient is a 31 year old female of Hispanic origin who presents today for a follow up of  Bipolar disorder. Patient today reports that she has been tolerating the higher dose of the Latuda well at 80 mg. Her mood has stabilized significantly. She is sleeping and eating well. Has not noticed any ups and downs in her mood. However she has been dealing with some medical stressors for the past few weeks and states she is dealing as well as she can. Denies Any suicidal thoughts. 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   Previous Psychotropic Medications: Yes   Substance Abuse History in the last 12 months:  No.  Consequences of Substance Abuse: Negative  Past Medical History:  Past Medical History:  Diagnosis Date  . Arrhythmia   . Bipolar disorder (HCC)   . Depression    No past surgical history on file.  Family Psychiatric History: see below  Family History:  Family History  Problem Relation Age of Onset  . Bipolar disorder Mother   . Drug abuse Mother   . Alcohol abuse Mother   . Obesity Mother   . Skin cancer Mother   . Heart disease Mother   . Cancer - Ovarian Mother   . Prostate cancer Father   . Depression Father   . Drug abuse Father   . Alcohol abuse Father   . Thyroid cancer Sister   . Depression Sister   . Anxiety disorder Sister   . Alcohol abuse Brother   . Drug abuse Brother   . Anxiety disorder Brother     Social History:   Social History   Social History  . Marital status: Single    Spouse name: N/A  . Number of children: N/A  . Years of  education: N/A   Social History Main Topics  . Smoking status: Never Smoker  . Smokeless tobacco: Never Used  . Alcohol use 3.0 oz/week    5 Cans of beer per week     Comment: not at the moment   . Drug use: No  . Sexual activity: Yes    Birth control/ protection: None   Other Topics Concern  . Not on file   Social History Narrative  . No narrative on file    Additional Social History: Patient is currently married and has 33-year-old twins. She also has a 33-year-old son from a previous relationship who spends half time with dad and half time with her.  Allergies:  No Known Allergies  Metabolic Disorder Labs: No results found for: HGBA1C, MPG No results found for: PROLACTIN Lab Results  Component Value Date   CHOL 147 02/06/2016   TRIG 186 (H) 02/06/2016  HDL 36 (L) 02/06/2016   LDLCALC 74 02/06/2016     Current Medications: Current Outpatient Prescriptions  Medication Sig Dispense Refill  . cetirizine (ZYRTEC) 1 MG/ML syrup Take by mouth daily.    . fluticasone (FLONASE) 50 MCG/ACT nasal spray     . hydrOXYzine (ATARAX/VISTARIL) 10 MG tablet Take 1 tablet (10 mg total) by mouth 3 (three) times daily as needed. 30 tablet 2  . lurasidone (LATUDA) 20 MG TABS tablet Take 4 tablets (80 mg total) by mouth every morning. 90 tablet 1  . traZODone (DESYREL) 100 MG tablet Take 1 tablet (100 mg total) by mouth at bedtime. (Patient not taking: Reported on 03/23/2017) 30 tablet 1   No current facility-administered medications for this visit.     Neurologic: Headache: No Seizure: No Paresthesias:No  Musculoskeletal: Strength & Muscle Tone: within normal limits Gait & Station: normal Patient leans: N/A  Psychiatric Specialty Exam: ROS  There were no vitals taken for this visit.There is no height or weight on file to calculate BMI.  General Appearance: Casual  Eye Contact:  Fair  Speech:  normal  Volume:  high  Mood:  stable  Affect:  congruent  Thought Process:   Coherent  Orientation:  Full (Time, Place, and Person)  Thought Content:  WDL  Suicidal Thoughts:  No  Homicidal Thoughts:  No  Memory:  Immediate;   Fair Recent;   Fair Remote;   Fair  Judgement:  Fair  Insight:  Fair  Psychomotor Activity:  Normal  Concentration:  Concentration: Fair and Attention Span: Fair  Recall:  [employer] of Knowledge:Fair  Language: Fair  Akathisia:  No  Handed:  Right  AIMS (if indicated):    Assets:  Communication Skills Desire for Improvement Financial Resources/Insurance Housing Physical Health Resilience Social Support Vocational/Educational  ADL's:  Intact  Cognition: WNL  Sleep:  good     Treatment Plan Summary: Medication management   Bipolar disorder mixed episode currently Continue Latuda to 80 mg once daily . Patient to monitor for side effects. Continue hydroxyzine at 10mg  1-3 times daily prn for anxiety Continue therapy  Insomnia Continue trazodone to 100mg  po qhs.  Continue therapy to develop coping mechanisms to deal with stress Return to clinic in 2 weeks  time or call before if necessary.  Time spent was 30 minutes on this session and more than 50% of this time was spent in counseling and strategizing.  Patrick North, MD 8/7/20181:06 PM

## 2017-04-21 ENCOUNTER — Other Ambulatory Visit: Payer: Self-pay | Admitting: Psychiatry

## 2017-05-06 ENCOUNTER — Ambulatory Visit (INDEPENDENT_AMBULATORY_CARE_PROVIDER_SITE_OTHER): Payer: BC Managed Care – PPO | Admitting: Psychiatry

## 2017-05-06 MED ORDER — HYDROXYZINE HCL 10 MG PO TABS: 10.0000 mg | ORAL_TABLET | Freq: Three times a day (TID) | ORAL | 2 refills | Status: DC | PRN

## 2017-05-06 MED ORDER — TRAZODONE HCL 100 MG PO TABS: 100.0000 mg | ORAL_TABLET | Freq: Every day | ORAL | 1 refills | Status: DC

## 2017-05-06 MED ORDER — LURASIDONE HCL 20 MG PO TABS: 80.0000 mg | ORAL_TABLET | Freq: Every morning | ORAL | 1 refills | Status: DC

## 2017-05-06 NOTE — Progress Notes (Signed)
Psychiatric Progress Note   Patient ID: Lindsay Brown, female   DOB: 04/18/1986, 31 y.o.   MRN: [id number]   Date of Evaluation:  05/06/2017 Referral Source: self referred Chief Complaint:  Doing better  Visit Diagnosis: Mixed bipolar disorder  History of Present Illness:  Patient is a 31 year old female of Hispanic origin who presents today for a follow up of  Bipolar disorder.  Her mood has stabilized significantly on the Latuda at 80mg  daily. She is sleeping and eating well. Has not noticed any ups and downs in her mood. Her new job is going well and she is handling well with the challenges. She is going to marital therapy with her husband every 2 weeks. States her husband is being more supportive when she needs space. Denies any suicidal thoughts.  Previous Psychotropic Medications: Yes   Substance Abuse History in the last 12 months:  No.  Consequences of Substance Abuse: Negative  Past Medical History:  Past Medical History:  Diagnosis Date  . Arrhythmia   . Bipolar disorder (HCC)   . Depression    No past surgical history on file.  Family Psychiatric History: see below  Family History:  Family History  Problem Relation Age of Onset  . Bipolar disorder Mother   . Drug abuse Mother   . Alcohol abuse Mother   . Obesity Mother   . Skin cancer Mother   . Heart disease Mother   . Cancer - Ovarian Mother   . Prostate cancer Father   . Depression Father   . Drug abuse Father   . Alcohol abuse Father   . Thyroid cancer Sister   . Depression Sister   . Anxiety disorder Sister   . Alcohol abuse Brother   . Drug abuse Brother   . Anxiety disorder Brother     Social History:   Social History   Social History  . Marital status: Single    Spouse name: N/A  . Number of children: N/A  . Years of education: N/A   Social History Main Topics  . Smoking status: Never Smoker  .  Smokeless tobacco: Never Used  . Alcohol use 3.0 oz/week    5 Cans of beer per week     Comment: not at the moment   . Drug use: No  . Sexual activity: Yes    Birth control/ protection: None   Other Topics Concern  . Not on file   Social History Narrative  . No narrative on file    Additional Social History: Patient is currently married and has 66-year-old twins. She also has a 16-year-old son from a previous relationship who spends half time with dad and half time with her.  Allergies:  No Known Allergies  Metabolic Disorder Labs: No results found for: HGBA1C, MPG No results found for: PROLACTIN Lab Results  Component Value Date   CHOL 147 02/06/2016   TRIG 186 (H) 02/06/2016   HDL 36 (L) 02/06/2016   LDLCALC 74 02/06/2016  Current Medications: Current Outpatient Prescriptions  Medication Sig Dispense Refill  . cetirizine (ZYRTEC) 1 MG/ML syrup Take by mouth daily.    . fluticasone (FLONASE) 50 MCG/ACT nasal spray     . hydrOXYzine (ATARAX/VISTARIL) 10 MG tablet Take 1 tablet (10 mg total) by mouth 3 (three) times daily as needed. 30 tablet 2  . lurasidone (LATUDA) 20 MG TABS tablet Take 4 tablets (80 mg total) by mouth every morning. 90 tablet 1  . traZODone (DESYREL) 100 MG tablet Take 1 tablet (100 mg total) by mouth at bedtime. 30 tablet 1  . traZODone (DESYREL) 50 MG tablet TAKE 1 TABLET BY MOUTH AT BEDTIME 60 tablet 1   No current facility-administered medications for this visit.     Neurologic: Headache: No Seizure: No Paresthesias:No  Musculoskeletal: Strength & Muscle Tone: within normal limits Gait & Station: normal Patient leans: N/A  Psychiatric Specialty Exam: ROS  There were no vitals taken for this visit.There is no height or weight on file to calculate BMI.  General Appearance: Casual  Eye Contact:  Fair  Speech:  normal  Volume:  normal  Mood:  stable  Affect:  congruent  Thought Process:  Coherent  Orientation:  Full (Time, Place,  and Person)  Thought Content:  WDL  Suicidal Thoughts:  No  Homicidal Thoughts:  No  Memory:  Immediate;   Fair Recent;   Fair Remote;   Fair  Judgement:  Fair  Insight:  Fair  Psychomotor Activity:  Normal  Concentration:  Concentration: Fair and Attention Span: Fair  Recall:  [employer] of Knowledge:Fair  Language: Fair  Akathisia:  No  Handed:  Right  AIMS (if indicated):    Assets:  Communication Skills Desire for Improvement Financial Resources/Insurance Housing Physical Health Resilience Social Support Vocational/Educational  ADL's:  Intact  Cognition: WNL  Sleep:  good     Treatment Plan Summary: Medication management   Bipolar disorder mixed episode currently Continue Latuda to 80 mg once daily . Patient to monitor for side effects. Continue hydroxyzine at 10mg  1-3 times daily prn for anxiety Continue therapy  Insomnia Continue trazodone to 100mg  po qhs.  Continue therapy to develop coping mechanisms to deal with stress and continue marital therapy. Return to clinic in 4 weeks  time or call before if necessary.  Time spent was 30 minutes on this session and more than 50% of this time was spent in counseling and strategizing.  Patrick North, MD 8/23/20184:08 PM

## 2017-05-21 ENCOUNTER — Other Ambulatory Visit: Payer: Self-pay | Admitting: Psychiatry

## 2017-05-26 ENCOUNTER — Other Ambulatory Visit: Payer: Self-pay | Admitting: Psychiatry

## 2017-05-26 ENCOUNTER — Ambulatory Visit: Payer: BC Managed Care – PPO | Admitting: Psychiatry

## 2017-05-31 ENCOUNTER — Encounter: Payer: Self-pay | Admitting: Psychiatry

## 2017-05-31 ENCOUNTER — Ambulatory Visit (INDEPENDENT_AMBULATORY_CARE_PROVIDER_SITE_OTHER): Payer: BC Managed Care – PPO | Admitting: Psychiatry

## 2017-05-31 DIAGNOSIS — F3160 Bipolar disorder, current episode mixed, unspecified: Secondary | ICD-10-CM | POA: Diagnosis not present

## 2017-05-31 MED ORDER — TRAZODONE HCL 100 MG PO TABS: 100.0000 mg | ORAL_TABLET | Freq: Every day | ORAL | 1 refills | Status: DC

## 2017-05-31 MED ORDER — LURASIDONE HCL 20 MG PO TABS: 80.0000 mg | ORAL_TABLET | Freq: Every morning | ORAL | 1 refills | Status: DC

## 2017-05-31 NOTE — Progress Notes (Signed)
Psychiatric Progress Note   Patient ID: Lindsay Brown, female   DOB: December 01, 1985, 31 y.o.   MRN: [id number]   Date of Evaluation:  05/31/2017 Referral Source: self referred Chief Complaint:  Doing better Chief Complaint    Follow-up; Medication Refill     Visit Diagnosis: Mixed bipolar disorder  History of Present Illness:  Patient is a 31 year old female of Hispanic origin who presents today for a follow up of  Bipolar disorder.  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 Her mood has stabilized significantly on the Latuda at  daily. She is sleeping and eating well.  She is continuing marital therapy with her husband every 2 weeks. Reports that they're beginning to work on some fundamental issues as well in addition to what had happened recently. Reports that her in-laws are quite supportive towards her and her husband.  Denies any suicidal thoughts.  Previous Psychotropic Medications: Yes   Substance Abuse History in the last 12 months:  No.  Consequences of Substance Abuse: Negative  Past Medical History:  Past Medical History:  Diagnosis Date  . Arrhythmia   . Bipolar disorder (HCC)   . Depression    History reviewed. No pertinent surgical history.  Family Psychiatric History: see below  Family History:  Family History  Problem Relation Age of Onset  . Bipolar disorder Mother   . Drug abuse Mother   . Alcohol abuse Mother   . Obesity Mother   . Skin cancer Mother   . Heart disease Mother   . Cancer - Ovarian Mother   . Prostate cancer Father   . Depression Father   . Drug abuse Father   . Alcohol abuse Father   . Thyroid cancer Sister   . Depression Sister   . Anxiety disorder Sister   . Alcohol abuse Brother   . Drug abuse Brother   . Anxiety disorder Brother      Social History:   Social History   Social History  . Marital status: Single    Spouse name: N/A  . Number of children: N/A  . Years of education: N/A   Social History Main Topics  . Smoking status: Never Smoker  . Smokeless tobacco: Never Used  . Alcohol use 3.0 oz/week    5 Cans of beer per week     Comment: not at the moment   . Drug use: No  . Sexual activity: Yes    Birth control/ protection: None   Other Topics Concern  . None   Social History Narrative  . None    Additional Social History: Patient is currently married and has 65-year-old twins. She also has a 43-year-old son from a previous relationship who spends half time with dad and half time with her.  Allergies:  No Known Allergies  Metabolic Disorder Labs: No results found for: HGBA1C, MPG No results found for: PROLACTIN Lab Results  Component Value Date   CHOL 147 02/06/2016   TRIG 186 (H) 02/06/2016   HDL 36 (L) 02/06/2016   LDLCALC 74 02/06/2016     Current Medications: Current Outpatient Prescriptions  Medication Sig Dispense Refill  . cetirizine (ZYRTEC) 1 MG/ML syrup Take by mouth daily.    . fluticasone (FLONASE) 50 MCG/ACT nasal spray     . hydrOXYzine (ATARAX/VISTARIL) 10 MG tablet Take 1 tablet (10 mg total) by mouth 3 (three) times daily as needed. 30 tablet 2  . LATUDA 20 MG TABS tablet TAKE 3 TABLETS (60 MG TOTAL) BY MOUTH EVERY MORNING. 90 tablet 1  . lurasidone (LATUDA) 20 MG TABS tablet Take 4 tablets (80 mg total) by mouth every morning. 120 tablet 1  . traZODone (DESYREL) 100 MG tablet Take 1 tablet (100 mg total) by mouth at bedtime. 30 tablet 1  . traZODone (DESYREL) 100 MG tablet TAKE 0.5 TABLETS (50 MG TOTAL) BY MOUTH AT BEDTIME. 30 tablet 1   No current facility-administered medications for this visit.     Neurologic: Headache: No Seizure: No Paresthesias:No  Musculoskeletal: Strength & Muscle Tone: within normal limits Gait & Station: normal Patient leans:  N/A  Psychiatric Specialty Exam: ROS  Blood pressure 115/78, pulse 85, temperature 98.5 F (36.9 C), temperature source Oral, weight 212 lb (96.2 kg).Body mass index is 33.45 kg/m.  General Appearance: Casual  Eye Contact:  Fair  Speech:  normal  Volume:  normal  Mood:  stable  Affect:  congruent  Thought Process:  Coherent  Orientation:  Full (Time, Place, and Person)  Thought Content:  WDL  Suicidal Thoughts:  No  Homicidal Thoughts:  No  Memory:  Immediate;   Fair Recent;   Fair Remote;   Fair  Judgement:  Fair  Insight:  Fair  Psychomotor Activity:  Normal  Concentration:  Concentration: Fair and Attention Span: Fair  Recall:  [employer] of Knowledge:Fair  Language: Fair  Akathisia:  No  Handed:  Right  AIMS (if indicated):    Assets:  Communication Skills Desire for Improvement Financial Resources/Insurance Housing Physical Health Resilience Social Support Vocational/Educational  ADL's:  Intact  Cognition: WNL  Sleep:  good     Treatment Plan Summary: Medication management   Bipolar disorder mixed episode currently Continue Latuda to 80 mg once daily . Patient to monitor for side effects. Continue hydroxyzine at  1-3 times daily prn for anxiety Continue therapy  Insomnia Continue trazodone to  po qhs.  Continue therapy to develop coping mechanisms to deal with stress and continue marital therapy. Return to clinic in 2 months  time or call before if necessary.  Time spent was 30 minutes on this session and more than 50% of this time was spent in counseling and strategizing.  Patrick North, MD 9/17/201811:51 AM

## 2017-07-31 ENCOUNTER — Other Ambulatory Visit: Payer: Self-pay | Admitting: Psychiatry

## 2017-08-03 ENCOUNTER — Encounter: Payer: Self-pay | Admitting: Psychiatry

## 2017-08-03 ENCOUNTER — Ambulatory Visit: Payer: BC Managed Care – PPO | Admitting: Psychiatry

## 2017-08-03 ENCOUNTER — Other Ambulatory Visit: Payer: Self-pay

## 2017-08-03 DIAGNOSIS — F3160 Bipolar disorder, current episode mixed, unspecified: Secondary | ICD-10-CM | POA: Diagnosis not present

## 2017-08-03 MED ORDER — LURASIDONE HCL 20 MG PO TABS: 60.0000 mg | ORAL_TABLET | Freq: Every morning | ORAL | 2 refills | Status: DC

## 2017-08-03 MED ORDER — TRAZODONE HCL 100 MG PO TABS: 100.0000 mg | ORAL_TABLET | Freq: Every day | ORAL | 1 refills | Status: DC

## 2017-08-03 NOTE — Progress Notes (Signed)
Psychiatric Progress Note   Patient ID: Lindsay Brown, female   DOB: 09-01-1986, 31 y.o.   MRN: [id number]   Date of Evaluation:  08/03/2017 Referral Source: self referred Chief Complaint:  Doing well Chief Complaint    Follow-up; Medication Refill     Visit Diagnosis: Mixed bipolar disorder  History of Present Illness:  Patient is a 31 year old female of Hispanic origin who presents today for a follow up of  Bipolar disorder.  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 States the management is supportive to her. States she felt a bit depressed recently and low energy levels and decreased the Latuda to 60mg  daily. States her mood is more stable. She also had some stress with her 33 yo son, who had some behavioral challenges. Denies any suicidal thoughts.  Previous Psychotropic Medications: Yes   Substance Abuse History in the last 12 months:  No.  Consequences of Substance Abuse: Negative  Past Medical History:  Past Medical History:  Diagnosis Date  . Arrhythmia   . Bipolar disorder (HCC)   . Depression    History reviewed. No pertinent surgical history.  Family Psychiatric History: see below  Family History:  Family History  Problem Relation Age of Onset  . Bipolar disorder Mother   . Drug abuse Mother   . Alcohol abuse Mother   . Obesity Mother   . Skin cancer Mother   . Heart disease Mother   . Cancer - Ovarian Mother   . Prostate cancer Father   . Depression Father   . Drug abuse Father   . Alcohol abuse Father   . Thyroid cancer Sister   . Depression Sister   . Anxiety disorder Sister   . Alcohol abuse Brother   . Drug abuse Brother   . Anxiety disorder Brother     Social History:   Social History   Socioeconomic History  . Marital status: Married    Spouse name: Jonny Ruizjohn   . Number of children: 3  . Years of education: None  .  Highest education level: Bachelor's degree (e.g., BA, AB, BS)  Social Needs  . Financial resource strain: Not hard at all  . Food insecurity - worry: Never true  . Food insecurity - inability: Never true  . Transportation needs - medical: No  . Transportation needs - non-medical: No  Occupational History  . Occupation: [employer]    Comment: full time  Tobacco Use  . Smoking status: Never Smoker  . Smokeless tobacco: Never Used  Substance and Sexual Activity  . Alcohol use: Yes    Alcohol/week: 1.2 oz    Types: 2 Glasses of wine per week    Comment: not at the moment   . Drug use: No  . Sexual activity: Yes    Birth control/protection: None  Other Topics Concern  . None  Social History Narrative  . None    Additional Social History: Patient is currently married and has 33-year-old  twins. She also has a 33-year-old son from a previous relationship who spends half time with dad and half time with her.  Allergies:  No Known Allergies  Metabolic Disorder Labs: No results found for: HGBA1C, MPG No results found for: PROLACTIN Lab Results  Component Value Date   CHOL 147 02/06/2016   TRIG 186 (H) 02/06/2016   HDL 36 (L) 02/06/2016   LDLCALC 74 02/06/2016     Current Medications: Current Outpatient Medications  Medication Sig Dispense Refill  . cetirizine (ZYRTEC) 1 MG/ML syrup Take by mouth daily.    . fluticasone (FLONASE) 50 MCG/ACT nasal spray     . hydrOXYzine (ATARAX/VISTARIL) 10 MG tablet Take 1 tablet (10 mg total) by mouth 3 (three) times daily as needed. 30 tablet 2  . lurasidone (LATUDA) 20 MG TABS tablet Take 4 tablets (80 mg total) by mouth every morning. 120 tablet 1  . traZODone (DESYREL) 100 MG tablet Take 1 tablet (100 mg total) by mouth at bedtime. 30 tablet 1   No current facility-administered medications for this visit.     Neurologic: Headache: No Seizure: No Paresthesias:No  Musculoskeletal: Strength & Muscle Tone: within normal  limits Gait & Station: normal Patient leans: N/A  Psychiatric Specialty Exam: ROS  Blood pressure 129/84, pulse 76, temperature 98.7 F (37.1 C), temperature source Oral, weight 218 lb 9.6 oz (99.2 kg).Body mass index is 34.49 kg/m.  General Appearance: Casual  Eye Contact:  Fair  Speech:  normal  Volume:  normal  Mood:  stable  Affect:  congruent  Thought Process:  Coherent  Orientation:  Full (Time, Place, and Person)  Thought Content:  WDL  Suicidal Thoughts:  No  Homicidal Thoughts:  No  Memory:  Immediate;   Fair Recent;   Fair Remote;   Fair  Judgement:  Fair  Insight:  Fair  Psychomotor Activity:  Normal  Concentration:  Concentration: Fair and Attention Span: Fair  Recall:  [employer]Fair  Fund of Knowledge:Fair  Language: Fair  Akathisia:  No  Handed:  Right  AIMS (if indicated):    Assets:  Communication Skills Desire for Improvement Financial Resources/Insurance Housing Physical Health Resilience Social Support Vocational/Educational  ADL's:  Intact  Cognition: WNL  Sleep:  good     Treatment Plan Summary: Medication management   Bipolar disorder mixed episode currently Decrease Latuda to 60 mg once daily . Patient to monitor for side effects. Continue hydroxyzine at 10mg  1-3 times daily prn for anxiety Continue therapy  Insomnia Continue trazodone to 100mg  po qhs.  Continue therapy to develop coping mechanisms to deal with stress and continue marital therapy. Return to clinic in 2 months  time or call before if necessary.  Time spent was 30 minutes on this session and more than 50% of this time was spent in counseling and strategizing.  Patrick NorthHimabindu Zeanna Sunde, MD 11/20/20189:14 AM

## 2017-09-27 ENCOUNTER — Ambulatory Visit (INDEPENDENT_AMBULATORY_CARE_PROVIDER_SITE_OTHER): Payer: BC Managed Care – PPO | Admitting: Psychiatry

## 2017-09-27 ENCOUNTER — Encounter: Payer: Self-pay | Admitting: Psychiatry

## 2017-09-27 ENCOUNTER — Other Ambulatory Visit: Payer: Self-pay

## 2017-09-27 VITALS — BP 146/94 | HR 98 | Temp 99.1°F | Wt 201.4 lb

## 2017-09-27 DIAGNOSIS — F3160 Bipolar disorder, current episode mixed, unspecified: Secondary | ICD-10-CM

## 2017-09-27 MED ORDER — TRAZODONE HCL 100 MG PO TABS: 100.0000 mg | ORAL_TABLET | Freq: Every day | ORAL | 1 refills | Status: DC
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
## 2017-09-29 ENCOUNTER — Ambulatory Visit: Payer: BC Managed Care – PPO | Admitting: Psychiatry

## 2017-09-30 ENCOUNTER — Ambulatory Visit: Payer: BC Managed Care – PPO | Admitting: Psychiatry

## 2017-10-11 ENCOUNTER — Ambulatory Visit: Payer: BC Managed Care – PPO | Admitting: Psychiatry

## 2017-10-11 ENCOUNTER — Other Ambulatory Visit: Payer: Self-pay

## 2017-10-11 ENCOUNTER — Encounter: Payer: Self-pay | Admitting: Psychiatry

## 2017-10-11 VITALS — BP 132/86 | HR 87 | Temp 98.5°F | Wt 198.6 lb

## 2017-10-11 DIAGNOSIS — F3160 Bipolar disorder, current episode mixed, unspecified: Secondary | ICD-10-CM

## 2017-10-11 NOTE — Progress Notes (Signed)
Psychiatric Progress Note   Patient ID: Lindsay Brown, female   DOB: 1986/07/04, 32 y.o.   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 She reports being very disappointed and sad at losing her job since this was a dream job. States that she is still trying to work out with her husband. States that currently he wants to have some space and think about what he wants to do. States that she is sleeping well and eating well. Denies any manic symptoms or other mood symptoms. She is looking at other job alternate isn't thinking about probably going back to school. Patient denies any suicidal thoughts.  Previous Psychotropic Medications: Yes   Substance Abuse History in the last 12 months:  No.  Consequences of Substance Abuse: Negative  Past Medical History:  Past Medical History:  Diagnosis Date  . Arrhythmia   . Bipolar disorder (HCC)   . Depression    No past surgical history on file.  Family Psychiatric History: see below  Family History:  Family History  Problem Relation Age of Onset  . Bipolar disorder Mother   . Drug abuse Mother   . Alcohol abuse Mother   . Obesity Mother   . Skin cancer Mother   . Heart disease Mother   . Cancer - Ovarian Mother   . Prostate cancer Father   . Depression Father   . Drug abuse Father   . Alcohol abuse Father   . Thyroid cancer Sister   . Depression Sister   . Anxiety disorder Sister   . Alcohol abuse Brother   . Drug abuse Brother   . Anxiety disorder  Brother     Social History:   Social History   Socioeconomic History  . Marital status: Married    Spouse name: Jonny Ruiz   . Number of children: 3  . Years of education: Not on file  . Highest education level: Bachelor's degree (e.g., BA, AB, BS)  Social Needs  . Financial resource strain: Not hard at all  . Food insecurity - worry: Never true  . Food insecurity - inability: Never true  . Transportation needs - medical: No  . Transportation needs - non-medical: No  Occupational History  . Occupation: [employer]    Comment: full time  Tobacco Use  . Smoking status: Never Smoker  . Smokeless tobacco: Never Used  Substance and Sexual Activity  . Alcohol use: Yes    Alcohol/week: 1.2 oz    Types: 2 Glasses of wine per week    Comment: not at the moment   . Drug use: No  .  Sexual activity: Yes    Birth control/protection: None  Other Topics Concern  . Not on file  Social History Narrative  . Not on file    Additional Social History: Patient is currently married and has 34-year-old twins. She also has a 117-year-old son from a previous relationship who spends half time with dad and half time with her.  Allergies:  No Known Allergies  Metabolic Disorder Labs: No results found for: HGBA1C, MPG No results found for: PROLACTIN Lab Results  Component Value Date   CHOL 147 02/06/2016   TRIG 186 (H) 02/06/2016   HDL 36 (L) 02/06/2016   LDLCALC 74 02/06/2016     Current Medications: Current Outpatient Medications  Medication Sig Dispense Refill  . cetirizine (ZYRTEC) 1 MG/ML syrup Take by mouth daily.    . fluticasone (FLONASE) 50 MCG/ACT nasal spray     . hydrOXYzine (ATARAX/VISTARIL) 10 MG tablet Take 1 tablet (10 mg total) by mouth 3 (three) times daily as needed. 30 tablet 2  . lurasidone (LATUDA) 20 MG TABS tablet Take 3 tablets (60 mg total) every morning by mouth. 90 tablet 2  . traZODone (DESYREL) 100 MG tablet Take 1 tablet (100 mg total) by mouth at bedtime.  30 tablet 1   No current facility-administered medications for this visit.     Neurologic: Headache: No Seizure: No Paresthesias:No  Musculoskeletal: Strength & Muscle Tone: within normal limits Gait & Station: normal Patient leans: N/A  Psychiatric Specialty Exam: ROS  There were no vitals taken for this visit.There is no height or weight on file to calculate BMI.  General Appearance: Casual  Eye Contact:  Fair  Speech:  normal  Volume:  normal  Mood:  stable  Affect:  congruent  Thought Process:  Coherent  Orientation:  Full (Time, Place, and Person)  Thought Content:  WDL  Suicidal Thoughts:  No  Homicidal Thoughts:  No  Memory:  Immediate;   Fair Recent;   Fair Remote;   Fair  Judgement:  Fair  Insight:  Fair  Psychomotor Activity:  Normal  Concentration:  Concentration: Fair and Attention Span: Fair  Recall:  [employer]  Fund of Knowledge:Fair  Language: Fair  Akathisia:  No  Handed:  Right  AIMS (if indicated):    Assets:  Communication Skills Desire for Improvement Financial Resources/Insurance Housing Physical Health Resilience Social Support Vocational/Educational  ADL's:  Intact  Cognition: WNL  Sleep:  good     Treatment Plan Summary: Medication management   Bipolar disorder mixed episode currently Continue Latuda at 60 mg once daily . Patient to monitor for side effects. Continue hydroxyzine at 10mg  1-3 times daily prn for anxiety Continue therapy  Insomnia Continue trazodone to 100mg  po qhs.  Continue therapy to develop coping mechanisms to deal with stress and continue marital therapy. Return to clinic in 2 weeks  time or call before if necessary.  Time spent was 30 minutes on this session and more than 50% of this time was spent in counseling and strategizing.  Patrick NorthHimabindu Topanga Alvelo, MD 1/28/201912:03 PM

## 2017-10-26 ENCOUNTER — Ambulatory Visit: Payer: BC Managed Care – PPO | Admitting: Psychiatry

## 2017-10-26 ENCOUNTER — Other Ambulatory Visit: Payer: Self-pay

## 2017-10-26 ENCOUNTER — Encounter: Payer: Self-pay | Admitting: Psychiatry

## 2017-10-26 VITALS — BP 130/85 | HR 96 | Temp 99.3°F | Wt 193.0 lb

## 2017-10-26 DIAGNOSIS — F3160 Bipolar disorder, current episode mixed, unspecified: Secondary | ICD-10-CM

## 2017-10-26 MED ORDER — LURASIDONE HCL 20 MG PO TABS: 60.0000 mg | ORAL_TABLET | Freq: Every morning | ORAL | 2 refills | Status: DC

## 2017-10-26 MED ORDER — TRAZODONE HCL 150 MG PO TABS: 150.0000 mg | ORAL_TABLET | Freq: Every day | ORAL | 1 refills | Status: DC

## 2017-10-26 NOTE — Progress Notes (Signed)
Psychiatric Progress Note   Patient ID: Lindsay Brown, female   DOB: [birth year], 32 y.o.   MRN: [id number]   Date of Evaluation:  10/26/2017 Referral Source: self referred Chief Complaint:  Not doing well Chief Complaint    Follow-up; Medication Refill     Visit Diagnosis: Mixed bipolar disorder  History of Present Illness:  Patient is a 32 year old female of Hispanic origin who presents today for a follow up of  Bipolar disorder. Patient today reports that she has not been doing well. States that she got papers served by her husband for custody and separation. States that she has been trying to reach out to him and talk to him. She is not interested in obtaining a divorce. Patient very tearful and states that she is unsure of how to go about this. [facility]. States that her financial resources are limited and she is worried about what she will do after this money runs out. She has not been sleeping well. Denies any manic symptoms. Denies any suicidal thoughts. She is also seeing her therapist on a regular basis.  Previous Psychotropic Medications: Yes   Substance Abuse History in the last 12 months:  No.  Consequences of Substance Abuse: Negative  Past Medical History:  Past Medical History:  Diagnosis Date  . Arrhythmia   . Bipolar disorder (HCC)   . Depression    History reviewed. No pertinent surgical history.  Family Psychiatric History: see below  Family History:  Family History  Problem Relation Age of Onset  . Bipolar disorder Mother   . Drug abuse Mother   . Alcohol abuse Mother   . Obesity Mother   . Skin cancer Mother   . Heart disease Mother   . Cancer - Ovarian Mother   . Prostate cancer Father   . Depression Father   . Drug abuse Father   . Alcohol abuse Father   . Thyroid cancer Sister   . Depression  Sister   . Anxiety disorder Sister   . Alcohol abuse Brother   . Drug abuse Brother   . Anxiety disorder Brother     Social History:   Social History   Socioeconomic History  . Marital status: Married    Spouse name: Lindsay Brown   . Number of children: 3  . Years of education: None  . Highest education level: Bachelor's degree (e.g., BA, AB, BS)  Social Needs  . Financial resource strain: Not hard at all  . Food insecurity - worry: Never true  . Food insecurity - inability: Never true  . Transportation needs - medical: No  . Transportation needs - non-medical: No  Occupational History  . Occupation: [employer]    Comment: full time  Tobacco Use  . Smoking status: Never Smoker  . Smokeless tobacco: Never Used  Substance and Sexual Activity  . Alcohol use: Yes    Alcohol/week: 1.2 oz    Types: 2 Glasses  of wine per week    Comment: not at the moment   . Drug use: No  . Sexual activity: Yes    Birth control/protection: None  Other Topics Concern  . None  Social History Narrative  . None    Additional Social History: Patient is currently married and has 34-year-old twins. She also has a 34-year-old son from a previous relationship who spends half time with dad and half time with her.  Allergies:  No Known Allergies  Metabolic Disorder Labs: No results found for: HGBA1C, MPG No results found for: PROLACTIN Lab Results  Component Value Date   CHOL 147 02/06/2016   TRIG 186 (H) 02/06/2016   HDL 36 (L) 02/06/2016   LDLCALC 74 02/06/2016     Current Medications: Current Outpatient Medications  Medication Sig Dispense Refill  . cetirizine (ZYRTEC) 1 MG/ML syrup Take by mouth daily.    . fluticasone (FLONASE) 50 MCG/ACT nasal spray     . hydrOXYzine (ATARAX/VISTARIL) 10 MG tablet Take 1 tablet (10 mg total) by mouth 3 (three) times daily as needed. 30 tablet 2  . lurasidone (LATUDA) 20 MG TABS tablet Take 3 tablets (60 mg total) by mouth every morning. 90 tablet 2   . traZODone (DESYREL) 150 MG tablet Take 1 tablet (150 mg total) by mouth at bedtime. 30 tablet 1   No current facility-administered medications for this visit.      Neurologic: Headache: No Seizure: No Paresthesias:No  Musculoskeletal: Strength & Muscle Tone: within normal limits Gait & Station: normal Patient leans: N/A  Psychiatric Specialty Exam: ROS  Blood pressure 130/85, pulse 96, temperature 99.3 F (37.4 C), temperature source Oral, weight 87.5 kg (193 lb).Body mass index is 30.45 kg/m.  General Appearance: Casual  Eye Contact:  Fair  Speech:  normal  Volume:  normal  Mood:  Depressed   Affect:  Tearful   Thought Process:  Coherent  Orientation:  Full (Time, Place, and Person)  Thought Content:  WDL  Suicidal Thoughts:  No  Homicidal Thoughts:  No  Memory:  Immediate;   Fair Recent;   Fair Remote;   Fair  Judgement:  Fair  Insight:  Fair  Psychomotor Activity:  Normal  Concentration:  Concentration: Fair and Attention Span: Fair  Recall:  [employer]Fair  Fund of Knowledge:Fair  Language: Fair  Akathisia:  No  Handed:  Right  AIMS (if indicated):    Assets:  Communication Skills Desire for Improvement Financial Resources/Insurance Housing Physical Health Resilience Social Support Vocational/Educational  ADL's:  Intact  Cognition: WNL  Sleep:  Very erratic     Treatment Plan Summary: Medication management   Bipolar disorder mixed episode currently Continue Latuda at 60 mg once daily . Patient to monitor for side effects. Continue hydroxyzine at 10mg  1-3 times daily prn for anxiety Continue therapy  Insomnia Increase trazodone to 150mg  po qhs.  Continue therapy to develop coping mechanisms to deal with stress and continue marital therapy. Return to clinic in 2 weeks  time or call before if necessary.  Time spent was 30 minutes on this session and more than 50% of this time was spent in counseling and strategizing.  Patrick NorthHimabindu Marnee Sherrard,  MD 2/12/20191:50 PM

## 2017-11-03 ENCOUNTER — Ambulatory Visit: Payer: BC Managed Care – PPO | Admitting: Psychiatry

## 2017-11-04 ENCOUNTER — Encounter: Payer: Self-pay | Admitting: Psychiatry

## 2017-11-04 ENCOUNTER — Ambulatory Visit: Payer: BC Managed Care – PPO | Admitting: Psychiatry

## 2017-11-04 ENCOUNTER — Other Ambulatory Visit: Payer: Self-pay

## 2017-11-04 VITALS — BP 133/83 | HR 96 | Temp 97.4°F | Wt 191.2 lb

## 2017-11-04 DIAGNOSIS — F3160 Bipolar disorder, current episode mixed, unspecified: Secondary | ICD-10-CM

## 2017-11-04 NOTE — Progress Notes (Signed)
Psychiatric Progress Note   Patient ID: Lindsay Brown, female   DOB: March 09, 1986, 32 y.o.   MRN: [id number]   Date of Evaluation:  11/04/2017 Referral Source: self referred Chief Complaint:  better Chief Complaint    Follow-up     Visit Diagnosis: Mixed bipolar disorder  History of Present Illness:  Patient is a 32 year old female of Hispanic origin who presents today for a follow up of  Bipolar disorder. Patient today reports that she has been better this week. Reports that her case was struck off and she did not have to appear before the judge. States that her lawyer and her husband's lawyer spoke to each other and this was decided. Feels that a big weight has been lifted from her. 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 She started taking the Spanish interpreter courses. States that things are still not great with the husband but they're trying to work it out. She has been sleeping better on the higher dose of the trazodone. She continues to exercise regularly. Denies any suicidal thoughts. She is also seeing her therapist on a regular basis.  Previous Psychotropic Medications: Yes   Substance Abuse History in the last 12 months:  No.  Consequences of Substance Abuse: Negative  Past Medical History:  Past Medical History:  Diagnosis Date  . Arrhythmia   . Bipolar disorder (HCC)   . Depression    History reviewed. No pertinent surgical history.  Family Psychiatric History: see below  Family History:  Family History  Problem Relation Age of Onset  . Bipolar disorder Mother   . Drug abuse Mother   . Alcohol abuse Mother   . Obesity Mother   . Skin cancer Mother   . Heart disease Mother   . Cancer - Ovarian Mother   . Prostate cancer Father   . Depression Father   . Drug abuse Father   . Alcohol abuse Father   . Thyroid cancer Sister   . Depression Sister   .  Anxiety disorder Sister   . Alcohol abuse Brother   . Drug abuse Brother   . Anxiety disorder Brother     Social History:   Social History   Socioeconomic History  . Marital status: Married    Spouse name: Jonny Ruizjohn   . Number of children: 3  . Years of education: None  . Highest education level: Bachelor's degree (e.g., BA, AB, BS)  Social Needs  . Financial resource strain: Not hard at all  . Food insecurity - worry: Never true  . Food insecurity - inability: Never true  . Transportation needs - medical: No  . Transportation needs - non-medical: No  Occupational History  . Occupation: [employer]    Comment: full time  Tobacco Use  . Smoking status: Never Smoker  . Smokeless tobacco: Never Used  Substance and Sexual Activity  . Alcohol use: Yes    Alcohol/week: 1.2 oz    Types: 2 Glasses of wine per week  Comment: not at the moment   . Drug use: No  . Sexual activity: Yes    Birth control/protection: None  Other Topics Concern  . None  Social History Narrative  . None    Additional Social History: Patient is currently married and has 43-year-old twins. She also has a 67-year-old son from a previous relationship who spends half time with dad and half time with her.  Allergies:  No Known Allergies  Metabolic Disorder Labs: No results found for: HGBA1C, MPG No results found for: PROLACTIN Lab Results  Component Value Date   CHOL 147 02/06/2016   TRIG 186 (H) 02/06/2016   HDL 36 (L) 02/06/2016   LDLCALC 74 02/06/2016     Current Medications: Current Outpatient Medications  Medication Sig Dispense Refill  . cetirizine (ZYRTEC) 1 MG/ML syrup Take by mouth daily.    . fluticasone (FLONASE) 50 MCG/ACT nasal spray     . hydrOXYzine (ATARAX/VISTARIL) 10 MG tablet Take 1 tablet (10 mg total) by mouth 3 (three) times daily as needed. 30 tablet 2  . lurasidone (LATUDA) 20 MG TABS tablet Take 3 tablets (60 mg total) by mouth every morning. 90 tablet 2  . traZODone  (DESYREL) 150 MG tablet Take 1 tablet (150 mg total) by mouth at bedtime. 30 tablet 1   No current facility-administered medications for this visit.      Neurologic: Headache: No Seizure: No Paresthesias:No  Musculoskeletal: Strength & Muscle Tone: within normal limits Gait & Station: normal Patient leans: N/A  Psychiatric Specialty Exam: ROS  Blood pressure 133/83, pulse 96, temperature (!) 97.4 F (36.3 C), temperature source Oral, weight 86.7 kg (191 lb 3.2 oz).Body mass index is 30.17 kg/m.  General Appearance: Casual  Eye Contact:  Fair  Speech:  normal  Volume:  normal  Mood:  improved  Affect: pleasant  Thought Process:  Coherent  Orientation:  Full (Time, Place, and Person)  Thought Content:  WDL  Suicidal Thoughts:  No  Homicidal Thoughts:  No  Memory:  Immediate;   Fair Recent;   Fair Remote;   Fair  Judgement:  Fair  Insight:  Fair  Psychomotor Activity:  Normal  Concentration:  Concentration: Fair and Attention Span: Fair  Recall:  [employer] of Knowledge:Fair  Language: Fair  Akathisia:  No  Handed:  Right  AIMS (if indicated):    Assets:  Communication Skills Desire for Improvement Financial Resources/Insurance Housing Physical Health Resilience Social Support Vocational/Educational  ADL's:  Intact  Cognition: WNL  Sleep:  better     Treatment Plan Summary: Medication management   Bipolar disorder mixed episode currently Continue Latuda at 60 mg once daily . Patient to monitor for side effects. Continue hydroxyzine at 10mg  1-3 times daily prn for anxiety Continue therapy  Insomnia Continue  trazodone at 150mg  po qhs.  Continue therapy to develop coping mechanisms to deal with stress and continue marital therapy. Return to clinic in 2 weeks  time or call before if necessary.  Time spent was 30 minutes on this session and more than 50% of this time was spent in counseling and strategizing.  Patrick North, MD 2/21/20191:56 PM

## 2017-11-18 ENCOUNTER — Encounter: Payer: Self-pay | Admitting: Psychiatry

## 2017-11-18 ENCOUNTER — Ambulatory Visit (INDEPENDENT_AMBULATORY_CARE_PROVIDER_SITE_OTHER): Payer: Self-pay | Admitting: Psychiatry

## 2017-11-18 ENCOUNTER — Other Ambulatory Visit: Payer: Self-pay

## 2017-11-18 VITALS — BP 138/83 | HR 99 | Temp 98.1°F | Wt 186.8 lb

## 2017-11-18 DIAGNOSIS — F3160 Bipolar disorder, current episode mixed, unspecified: Secondary | ICD-10-CM

## 2017-11-18 NOTE — Progress Notes (Signed)
Psychiatric Progress Note   Patient ID: Lindsay Brown, female   DOB: May 16, 1986, 32 y.o.   MRN: [id number]   Date of Evaluation:  11/18/2017 Referral Source: self referred Chief Complaint:  better Chief Complaint    Follow-up; Medication Refill     Visit Diagnosis: Mixed bipolar disorder  History of Present Illness:  Patient is a 32 year old female of Hispanic origin who presents today for a follow up of  Bipolar disorder. Patient today reports that she has been doing okay. States she is trying to work things out with her husband. They spent the night at their house yesterday and she is not happy with certain things. 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  She has been sleeping better on the higher dose of the trazodone. She continues to exercise regularly. She lost 30 lbs. Denies any suicidal thoughts. She is also seeing her therapist on a regular basis.  Previous Psychotropic Medications: Yes   Substance Abuse History in the last 12 months:  No.  Consequences of Substance Abuse: Negative  Past Medical History:  Past Medical History:  Diagnosis Date  . Arrhythmia   . Bipolar disorder (HCC)   . Depression    History reviewed. No pertinent surgical history.  Family Psychiatric History: see below  Family History:  Family History  Problem Relation Age of Onset  . Bipolar disorder Mother   . Drug abuse Mother   . Alcohol abuse Mother   . Obesity Mother   . Skin cancer Mother   . Heart disease Mother   . Cancer - Ovarian Mother   . Prostate cancer Father   . Depression Father   . Drug abuse Father   . Alcohol abuse Father   . Thyroid cancer Sister   . Depression Sister   . Anxiety disorder Sister   . Alcohol abuse Brother   . Drug abuse Brother   . Anxiety disorder Brother     Social History:   Social History   Socioeconomic History  . Marital  status: Married    Spouse name: Jonny Ruizjohn   . Number of children: 3  . Years of education: None  . Highest education level: Bachelor's degree (e.g., BA, AB, BS)  Social Needs  . Financial resource strain: Not hard at all  . Food insecurity - worry: Never true  . Food insecurity - inability: Never true  . Transportation needs - medical: No  . Transportation needs - non-medical: No  Occupational History  . Occupation: [employer]    Comment: full time  Tobacco Use  . Smoking status: Never Smoker  . Smokeless tobacco: Never Used  Substance and Sexual Activity  . Alcohol use: Yes    Alcohol/week: 1.2 oz    Types: 2 Glasses of wine per week    Comment: not at the moment   . Drug use: No  . Sexual activity: Yes    Birth control/protection: None  Other Topics Concern  . None  Social History Narrative  . None    Additional Social History: Patient is currently married and has 25-year-old twins. She also has a 110-year-old son from a previous relationship who spends half time with dad and half time with her.  Allergies:  No Known Allergies  Metabolic Disorder Labs: No results found for: HGBA1C, MPG No results found for: PROLACTIN Lab Results  Component Value Date   CHOL 147 02/06/2016   TRIG 186 (H) 02/06/2016   HDL 36 (L) 02/06/2016   LDLCALC 74 02/06/2016     Current Medications: Current Outpatient Medications  Medication Sig Dispense Refill  . cetirizine (ZYRTEC) 1 MG/ML syrup Take by mouth daily.    . fluticasone (FLONASE) 50 MCG/ACT nasal spray     . hydrOXYzine (ATARAX/VISTARIL) 10 MG tablet Take 1 tablet (10 mg total) by mouth 3 (three) times daily as needed. 30 tablet 2  . lurasidone (LATUDA) 20 MG TABS tablet Take 3 tablets (60 mg total) by mouth every morning. 90 tablet 2  . traZODone (DESYREL) 150 MG tablet Take 1 tablet (150 mg total) by mouth at bedtime. 30 tablet 1   No current facility-administered medications for this visit.      Neurologic: Headache:  No Seizure: No Paresthesias:No  Musculoskeletal: Strength & Muscle Tone: within normal limits Gait & Station: normal Patient leans: N/A  Psychiatric Specialty Exam: ROS  Blood pressure 138/83, pulse 99, temperature 98.1 F (36.7 C), temperature source Oral, weight 84.7 kg (186 lb 12.8 oz).Body mass index is 29.48 kg/m.  General Appearance: Casual  Eye Contact:  Fair  Speech:  normal  Volume:  normal  Mood:  improved  Affect: pleasant  Thought Process:  Coherent  Orientation:  Full (Time, Place, and Person)  Thought Content:  WDL  Suicidal Thoughts:  No  Homicidal Thoughts:  No  Memory:  Immediate;   Fair Recent;   Fair Remote;   Fair  Judgement:  Fair  Insight:  Fair  Psychomotor Activity:  Normal  Concentration:  Concentration: Fair and Attention Span: Fair  Recall:  [employer] of Knowledge:Fair  Language: Fair  Akathisia:  No  Handed:  Right  AIMS (if indicated):    Assets:  Communication Skills Desire for Improvement Financial Resources/Insurance Housing Physical Health Resilience Social Support Vocational/Educational  ADL's:  Intact  Cognition: WNL  Sleep:  better     Treatment Plan Summary: Medication management   Bipolar disorder mixed episode currently Continue Latuda at 60 mg once daily . Patient to monitor for side effects. Continue hydroxyzine at 10mg  1-3 times daily prn for anxiety Continue therapy  Insomnia Continue  trazodone at 150mg  po qhs.  Continue therapy to develop coping mechanisms to deal with stress and continue marital therapy. Return to clinic in 4 weeks  time or call before if necessary.  Time spent was 30 minutes on this session and more than 50% of this time was spent in counseling and strategizing.  Patrick North, MD 3/7/20192:48 PM

## 2017-11-26 ENCOUNTER — Other Ambulatory Visit: Payer: Self-pay

## 2017-11-26 DIAGNOSIS — F5105 Insomnia due to other mental disorder: Secondary | ICD-10-CM

## 2017-11-26 DIAGNOSIS — F99 Mental disorder, not otherwise specified: Principal | ICD-10-CM

## 2017-11-26 NOTE — Telephone Encounter (Signed)
Received a fax requesting a refill for a 90 day supply   traZODone (DESYREL) 150 MG tablet  Medication  Date: 10/26/2017 Department: [facility] Ordering/Authorizing: Patrick Northavi, Himabindu, MD  Order Providers   Prescribing Provider Encounter Provider  Patrick Northavi, Himabindu, MD Patrick Northavi, Himabindu, MD  Medication Detail    Disp Refills Start End   traZODone (DESYREL) 150 MG tablet 30 tablet 1 10/26/2017    Sig - Route: Take 1 tablet (150 mg total) by mouth at bedtime. - Oral   Sent to pharmacy as: traZODone (DESYREL) 150 MG tablet   E-Prescribing Status: Receipt confirmed by pharmacy (10/26/2017 1:32 PM EST)

## 2017-11-29 MED ORDER — TRAZODONE HCL 150 MG PO TABS: 150.0000 mg | ORAL_TABLET | Freq: Every day | ORAL | 0 refills | Status: DC

## 2017-11-29 NOTE — Telephone Encounter (Signed)
Received a request from the pharmacy requesting a 90 day supply of medication.   traZODone (DESYREL) 150 MG tablet  Medication  Date: 10/26/2017 Department: [facility] Ordering/Authorizing: Patrick Northavi, Himabindu, MD  Order Providers   Prescribing Provider Encounter Provider  Patrick Northavi, Himabindu, MD Patrick Northavi, Himabindu, MD  Medication Detail    Disp Refills Start End   traZODone (DESYREL) 150 MG tablet 30 tablet 1 10/26/2017    Sig - Route: Take 1 tablet (150 mg total) by mouth at bedtime. - Oral   Sent to pharmacy as: traZODone (DESYREL) 150 MG tablet   E-Prescribing Status: Receipt confirmed by pharmacy (10/26/2017 1:32 PM EST)

## 2017-11-29 NOTE — Telephone Encounter (Signed)
SENT 90 DAYS OF TRAZODONE TO PHARMACY.

## 2017-11-30 ENCOUNTER — Other Ambulatory Visit: Payer: Self-pay | Admitting: Psychiatry

## 2017-11-30 NOTE — Telephone Encounter (Signed)
Thank you :)

## 2017-12-10 NOTE — Telephone Encounter (Signed)
Received a fax requesting a refill on the hydroxyzine pt was last seen on  11-18-17 next appt 12-16-17   hydrOXYzine (ATARAX/VISTARIL) 10 MG tablet  Medication  Date: 05/06/2017 Department: [facility] Ordering/Authorizing: Patrick Northavi, Himabindu, MD  Order Providers   Prescribing Provider Encounter Provider  Patrick Northavi, Himabindu, MD Patrick Northavi, Himabindu, MD  Outpatient Medication Detail    Disp Refills Start End   hydrOXYzine (ATARAX/VISTARIL) 10 MG tablet 30 tablet 2 05/06/2017    Sig - Route: Take 1 tablet (10 mg total) by mouth 3 (three) times daily as needed. - Oral   Sent to pharmacy as: hydrOXYzine (ATARAX/VISTARIL) 10 MG tablet   E-Prescribing Status: Receipt confirmed by pharmacy (05/06/2017 4:15 PM EDT)

## 2017-12-15 NOTE — Telephone Encounter (Signed)
Faxed and confirmed rx for hydroxyzine 10mg  id #[id number] order # [id number]

## 2017-12-16 ENCOUNTER — Encounter: Payer: Self-pay | Admitting: Psychiatry

## 2017-12-16 ENCOUNTER — Ambulatory Visit: Payer: Self-pay | Admitting: Psychiatry

## 2017-12-16 ENCOUNTER — Other Ambulatory Visit: Payer: Self-pay

## 2017-12-16 VITALS — BP 107/72 | HR 76 | Temp 98.6°F | Wt 182.8 lb

## 2017-12-16 DIAGNOSIS — F3160 Bipolar disorder, current episode mixed, unspecified: Secondary | ICD-10-CM

## 2017-12-16 NOTE — Progress Notes (Signed)
Psychiatric Progress Note   Patient ID: Lindsay Brown, female   DOB: 1986-05-31, 32 y.o.   MRN: [id number]   Date of Evaluation:  12/16/2017 Referral Source: self referred Chief Complaint:  better Chief Complaint    Follow-up; Medication Refill     Visit Diagnosis: Mixed bipolar disorder  History of Present Illness:  Patient is a 32 year old female of Hispanic origin who presents today for a follow up of  Bipolar disorder. Patient today reports that she has been doing better. States she and her husband have been living in the house with the kids for 3 weeks now. She reports it has been ok. Her husband is refusing to go to therapy with her, he refuses to address some of her concerns. 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 Sh eis working out regularly and lost 36 lbs.. She is also seeing her therapist on a regular basis. Fair sleep and appetite.   Previous Psychotropic Medications: Yes   Substance Abuse History in the last 12 months:  No.  Consequences of Substance Abuse: Negative  Past Medical History:  Past Medical History:  Diagnosis Date  . Arrhythmia   . Bipolar disorder (HCC)   . Depression    History reviewed. No pertinent surgical history.  Family Psychiatric History: see below  Family History:  Family History  Problem Relation Age of Onset  . Bipolar disorder Mother   . Drug abuse Mother   . Alcohol abuse Mother   . Obesity Mother   . Skin cancer Mother   . Heart disease Mother   . Cancer - Ovarian Mother   . Prostate cancer Father   . Depression Father   . Drug abuse Father   . Alcohol abuse Father   . Thyroid cancer Sister   . Depression Sister   . Anxiety disorder Sister   . Alcohol abuse Brother   . Drug abuse Brother   . Anxiety disorder Brother     Social History:   Social History    Socioeconomic History  . Marital status: Married    Spouse name: Jonny Ruiz   . Number of children: 3  . Years of education: Not on file  . Highest education level: Bachelor's degree (e.g., BA, AB, BS)  Occupational History  . Occupation: [employer]    Comment: full time  Social Needs  . Financial resource strain: Not hard at all  . Food insecurity:    Worry: Never true    Inability: Never true  . Transportation needs:    Medical: No    Non-medical: No  Tobacco Use  . Smoking status: Never Smoker  . Smokeless tobacco: Never Used  Substance and Sexual Activity  . Alcohol use: Yes    Alcohol/week: 1.2 oz    Types: 2 Glasses of wine per week    Comment: not at the moment   . Drug use: No  . Sexual activity: Yes  Birth control/protection: None  Lifestyle  . Physical activity:    Days per week: 0 days    Minutes per session: 0 min  . Stress: To some extent  Relationships  . Social connections:    Talks on phone: Once a week    Gets together: Once a week    Attends religious service: Never    Active member of club or organization: No    Attends meetings of clubs or organizations: Never    Relationship status: Married  Other Topics Concern  . Not on file  Social History Narrative  . Not on file    Additional Social History: Patient is currently married and has 41-year-old twins. She also has a 43-year-old son from a previous relationship who spends half time with dad and half time with her.  Allergies:  No Known Allergies  Metabolic Disorder Labs: No results found for: HGBA1C, MPG No results found for: PROLACTIN Lab Results  Component Value Date   CHOL 147 02/06/2016   TRIG 186 (H) 02/06/2016   HDL 36 (L) 02/06/2016   LDLCALC 74 02/06/2016     Current Medications: Current Outpatient Medications  Medication Sig Dispense Refill  . cetirizine (ZYRTEC) 1 MG/ML syrup Take by mouth daily.    . fluticasone (FLONASE) 50 MCG/ACT nasal spray     . hydrOXYzine  (ATARAX/VISTARIL) 10 MG tablet Take 1 tablet (10 mg total) by mouth 3 (three) times daily as needed. 30 tablet 2  . hydrOXYzine (ATARAX/VISTARIL) 10 MG tablet TAKE 1 TABLET (10 MG TOTAL) BY MOUTH 3 (THREE) TIMES DAILY AS NEEDED. 30 tablet 2  . lurasidone (LATUDA) 20 MG TABS tablet Take 3 tablets (60 mg total) by mouth every morning. 90 tablet 2  . traZODone (DESYREL) 150 MG tablet Take 1 tablet (150 mg total) by mouth at bedtime. 90 tablet 0   No current facility-administered medications for this visit.      Neurologic: Headache: No Seizure: No Paresthesias:No  Musculoskeletal: Strength & Muscle Tone: within normal limits Gait & Station: normal Patient leans: N/A  Psychiatric Specialty Exam: ROS  Blood pressure 107/72, pulse 76, temperature 98.6 F (37 C), temperature source Oral, weight 82.9 kg (182 lb 12.8 oz).Body mass index is 28.85 kg/m.  General Appearance: Casual  Eye Contact:  Fair  Speech:  normal  Volume:  normal  Mood:  improved  Affect: pleasant  Thought Process:  Coherent  Orientation:  Full (Time, Place, and Person)  Thought Content:  WDL  Suicidal Thoughts:  No  Homicidal Thoughts:  No  Memory:  Immediate;   Fair Recent;   Fair Remote;   Fair  Judgement:  Fair  Insight:  Fair  Psychomotor Activity:  Normal  Concentration:  Concentration: Fair and Attention Span: Fair  Recall:  [employer] of Knowledge:Fair  Language: Fair  Akathisia:  No  Handed:  Right  AIMS (if indicated):    Assets:  Communication Skills Desire for Improvement Financial Resources/Insurance Housing Physical Health Resilience Social Support Vocational/Educational  ADL's:  Intact  Cognition: WNL  Sleep:  better     Treatment Plan Summary: Medication management   Bipolar disorder mixed episode currently Continue Latuda at 60 mg once daily . Patient to monitor for side effects. Continue hydroxyzine at 10mg  1-3 times daily prn for anxiety Continue  therapy  Insomnia Continue  trazodone at 150mg  po qhs.  Continue therapy to develop coping mechanisms to deal with stress and continue marital therapy. Return to clinic in 4 weeks  time or call before if necessary.  Time spent was 30 minutes on this session and more than 50% of this time was spent in counseling and strategizing.  Patrick NorthHimabindu Larnie Heart, MD 4/4/20193:08 PM

## 2017-12-21 ENCOUNTER — Telehealth: Payer: Self-pay

## 2017-12-21 NOTE — Telephone Encounter (Signed)
pt called stated that she lost her insurance and they are trying to get her added onto her husband insurance but they have not heard back from them.  she states that the latuda was over $1000 and she doesnt have the money.  can I gave pt a month supply of samples until they can find out something about the insurance?

## 2017-12-21 NOTE — Telephone Encounter (Signed)
Ok thank you 

## 2017-12-21 NOTE — Telephone Encounter (Signed)
Sure. Please give her samples.

## 2017-12-21 NOTE — Telephone Encounter (Signed)
Pt called samples will be at front desk . Lot # [id number] exp 10/22 4 pks

## 2018-01-14 ENCOUNTER — Telehealth (HOSPITAL_COMMUNITY): Payer: Self-pay

## 2018-01-14 NOTE — Telephone Encounter (Signed)
spoke with patient reminiding her that we need the proof of income for the latuda application

## 2018-01-18 ENCOUNTER — Encounter: Payer: Self-pay | Admitting: Psychiatry

## 2018-01-18 ENCOUNTER — Other Ambulatory Visit: Payer: Self-pay

## 2018-01-18 ENCOUNTER — Ambulatory Visit (INDEPENDENT_AMBULATORY_CARE_PROVIDER_SITE_OTHER): Payer: Self-pay | Admitting: Psychiatry

## 2018-01-18 VITALS — BP 130/82 | HR 93 | Temp 98.4°F | Wt 181.6 lb

## 2018-01-18 DIAGNOSIS — F3160 Bipolar disorder, current episode mixed, unspecified: Secondary | ICD-10-CM

## 2018-01-18 NOTE — Progress Notes (Signed)
Psychiatric Progress Note   Patient ID: Lindsay Brown, female   DOB: 01-Feb-1986, 32 y.o.   MRN: [id number]   Date of Evaluation:  01/18/2018 Referral Source: self referred Chief Complaint:  Not doing well Chief Complaint    Follow-up; Medication Refill     Visit Diagnosis: Mixed bipolar disorder  History of Present Illness:  Patient is a 32 year old female of Hispanic origin who presents today for a follow up of  Bipolar disorder. Patient today reports that she has been out of her Jordan. She was taking a reduced dose and can tell that she is having mood changes.  She lost her insurance and was not able to fill her prescription. She continues to sleep well. However has noticed a dip in her mood, not feeling energetic. Her twins have been quite sick with pneumonia. She is trying to work with her husband. States finances are an issue. She is trying to find a job. 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 She is also seeing her therapist on a regular basis. Fair sleep and appetite.   Previous Psychotropic Medications: Yes   Substance Abuse History in the last 12 months:  No.  Consequences of Substance Abuse: Negative  Past Medical History:  Past Medical History:  Diagnosis Date  . Arrhythmia   . Bipolar disorder (HCC)   . Depression    History reviewed. No pertinent surgical history.  Family Psychiatric History: see below  Family History:  Family History  Problem Relation Age of Onset  . Bipolar disorder Mother   . Drug abuse Mother   . Alcohol abuse Mother   . Obesity Mother   . Skin cancer Mother   . Heart disease Mother   . Cancer - Ovarian Mother   . Prostate cancer Father   . Depression Father   . Drug abuse Father   . Alcohol abuse Father   . Thyroid cancer Sister   . Depression Sister   . Anxiety disorder Sister   . Alcohol abuse Brother   . Drug  abuse Brother   . Anxiety disorder Brother     Social History:   Social History   Socioeconomic History  . Marital status: Married    Spouse name: Jonny Ruiz   . Number of children: 3  . Years of education: Not on file  . Highest education level: Bachelor's degree (e.g., BA, AB, BS)  Occupational History  . Occupation: [employer]    Comment: full time  Social Needs  . Financial resource strain: Not hard at all  . Food insecurity:    Worry: Never true    Inability: Never true  . Transportation needs:    Medical: No    Non-medical: No  Tobacco Use  . Smoking status: Never Smoker  . Smokeless tobacco: Never Used  Substance and Sexual Activity  . Alcohol use: Yes    Alcohol/week: 1.2 oz    Types: 2 Glasses of wine per week  Comment: not at the moment   . Drug use: No  . Sexual activity: Yes    Birth control/protection: None  Lifestyle  . Physical activity:    Days per week: 0 days    Minutes per session: 0 min  . Stress: To some extent  Relationships  . Social connections:    Talks on phone: Once a week    Gets together: Once a week    Attends religious service: Never    Active member of club or organization: No    Attends meetings of clubs or organizations: Never    Relationship status: Married  Other Topics Concern  . Not on file  Social History Narrative  . Not on file    Additional Social History: Patient is currently married and has 21-year-old twins. She also has a 20-year-old son from a previous relationship who spends half time with dad and half time with her.  Allergies:  No Known Allergies  Metabolic Disorder Labs: No results found for: HGBA1C, MPG No results found for: PROLACTIN Lab Results  Component Value Date   CHOL 147 02/06/2016   TRIG 186 (H) 02/06/2016   HDL 36 (L) 02/06/2016   LDLCALC 74 02/06/2016     Current Medications: Current Outpatient Medications  Medication Sig Dispense Refill  . cetirizine (ZYRTEC) 1 MG/ML syrup Take by  mouth daily.    . fluticasone (FLONASE) 50 MCG/ACT nasal spray     . hydrOXYzine (ATARAX/VISTARIL) 10 MG tablet Take 1 tablet (10 mg total) by mouth 3 (three) times daily as needed. 30 tablet 2  . hydrOXYzine (ATARAX/VISTARIL) 10 MG tablet TAKE 1 TABLET (10 MG TOTAL) BY MOUTH 3 (THREE) TIMES DAILY AS NEEDED. 30 tablet 2  . lurasidone (LATUDA) 20 MG TABS tablet Take 3 tablets (60 mg total) by mouth every morning. 90 tablet 2  . traZODone (DESYREL) 150 MG tablet Take 1 tablet (150 mg total) by mouth at bedtime. 90 tablet 0   No current facility-administered medications for this visit.      Neurologic: Headache: No Seizure: No Paresthesias:No  Musculoskeletal: Strength & Muscle Tone: within normal limits Gait & Station: normal Patient leans: N/A  Psychiatric Specialty Exam: ROS  Blood pressure 130/82, pulse 93, temperature 98.4 F (36.9 C), temperature source Oral, weight 82.4 kg (181 lb 9.6 oz).Body mass index is 28.66 kg/m.  General Appearance: Casual  Eye Contact:  Fair  Speech:  normal  Volume:  normal  Mood: down lately  Affect: pleasant  Thought Process:  Coherent  Orientation:  Full (Time, Place, and Person)  Thought Content:  WDL  Suicidal Thoughts:  No  Homicidal Thoughts:  No  Memory:  Immediate;   Fair Recent;   Fair Remote;   Fair  Judgement:  Fair  Insight:  Fair  Psychomotor Activity:  Normal  Concentration:  Concentration: Fair and Attention Span: Fair  Recall:  [employer] of Knowledge:Fair  Language: Fair  Akathisia:  No  Handed:  Right  AIMS (if indicated):    Assets:  Communication Skills Desire for Improvement Financial Resources/Insurance Housing Physical Health Resilience Social Support Vocational/Educational  ADL's:  Intact  Cognition: WNL  Sleep:  better     Treatment Plan Summary: Medication management   Bipolar disorder mixed episode currently Continue Latuda at 60 mg once daily . Patient to monitor for side effects. Patient  was given samples and she will resume at the regular dose of 60 mg of Latuda daily. Continue hydroxyzine at  1-3 times  daily prn for anxiety Continue therapy  Insomnia Continue  trazodone at  po qhs.  Continue therapy to develop coping mechanisms to deal with stress and continue marital therapy. Return to clinic in 4 weeks  time or call before if necessary.  Time spent was 30 minutes on this session and more than 50% of this time was spent in counseling and strategizing.  Patrick North, MD 5/7/20194:55 PM

## 2018-01-20 NOTE — Telephone Encounter (Signed)
Info was faxed and confirmed pending

## 2018-02-01 ENCOUNTER — Other Ambulatory Visit: Payer: Self-pay | Admitting: Psychiatry

## 2018-02-01 MED ORDER — LURASIDONE HCL 20 MG PO TABS: 60.0000 mg | ORAL_TABLET | Freq: Every morning | ORAL | 2 refills | Status: DC

## 2018-03-01 ENCOUNTER — Encounter: Payer: Self-pay | Admitting: Psychiatry

## 2018-03-01 ENCOUNTER — Ambulatory Visit (INDEPENDENT_AMBULATORY_CARE_PROVIDER_SITE_OTHER): Payer: BLUE CROSS/BLUE SHIELD | Admitting: Psychiatry

## 2018-03-01 VITALS — BP 117/80 | HR 62 | Ht 66.75 in | Wt 179.4 lb

## 2018-03-01 DIAGNOSIS — F3160 Bipolar disorder, current episode mixed, unspecified: Secondary | ICD-10-CM | POA: Diagnosis not present

## 2018-03-01 NOTE — Progress Notes (Signed)
Psychiatric Progress Note   Patient ID: Lindsay Brown, female   DOB: 05/10/1986, 32 y.o.   MRN: [id number]   Date of Evaluation:  03/01/2018 Referral Source: self referred Chief Complaint:  Doing better  Visit Diagnosis: Mixed bipolar disorder  History of Present Illness:  Patient is a 32 year old female of Hispanic origin who presents today for a follow up of  Bipolar disorder. Patient today reports that she has been doing well moodwise since restarting the JordanLatuda. She is sleeping well and functioning well. She was offered a teacher`s job in [place] and she is happy about this. She is trying to look for a job close by. 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 States things are ok with her husband. Fair sleep and appetite.  Denies any suicidal thoughts.  Previous Psychotropic Medications: Yes   Substance Abuse History in the last 12 months:  No.  Consequences of Substance Abuse: Negative  Past Medical History:  Past Medical History:  Diagnosis Date  . Arrhythmia   . Bipolar disorder (HCC)   . Depression    History reviewed. No pertinent surgical history.  Family Psychiatric History: see below  Family History:  Family History  Problem Relation Age of Onset  . Bipolar disorder Mother   . Drug abuse Mother   . Alcohol abuse Mother   . Obesity Mother   . Skin cancer Mother   . Heart disease Mother   . Cancer - Ovarian Mother   . Prostate cancer Father   . Depression Father   . Drug abuse Father   . Alcohol abuse Father   . Thyroid cancer Sister   . Depression Sister   . Anxiety disorder Sister   . Alcohol abuse Brother   . Drug abuse Brother   . Anxiety disorder Brother     Social History:   Social History   Socioeconomic History  . Marital status: Married    Spouse name: Jonny Ruizjohn   . Number of children: 3  . Years of education: Not on file  . Highest education level:  Bachelor's degree (e.g., BA, AB, BS)  Occupational History  . Occupation: [employer]    Comment: full time  Social Needs  . Financial resource strain: Not hard at all  . Food insecurity:    Worry: Never true    Inability: Never true  . Transportation needs:    Medical: No    Non-medical: No  Tobacco Use  . Smoking status: Never Smoker  . Smokeless tobacco: Never Used  Substance and Sexual Activity  . Alcohol use: Yes    Alcohol/week: 1.2 oz    Types: 2 Glasses of wine per week    Comment: not at the moment   . Drug use: No  . Sexual activity: Yes    Birth control/protection: None  Lifestyle  . Physical activity:    Days per week: 0 days    Minutes per session: 0 min  . Stress: To some extent  Relationships  . Social connections:  Talks on phone: Once a week    Gets together: Once a week    Attends religious service: Never    Active member of club or organization: No    Attends meetings of clubs or organizations: Never    Relationship status: Married  Other Topics Concern  . Not on file  Social History Narrative  . Not on file    Additional Social History: Patient is currently married and has 4-year-old twins. She also has a 37-year-old son from a previous relationship who spends half time with dad and half time with her.  Allergies:  No Known Allergies  Metabolic Disorder Labs: No results found for: HGBA1C, MPG No results found for: PROLACTIN Lab Results  Component Value Date   CHOL 147 02/06/2016   TRIG 186 (H) 02/06/2016   HDL 36 (L) 02/06/2016   LDLCALC 74 02/06/2016     Current Medications: Current Outpatient Medications  Medication Sig Dispense Refill  . cetirizine (ZYRTEC) 1 MG/ML syrup Take by mouth daily.    . fluticasone (FLONASE) 50 MCG/ACT nasal spray     . hydrOXYzine (ATARAX/VISTARIL) 10 MG tablet Take 1 tablet (10 mg total) by mouth 3 (three) times daily as needed. 30 tablet 2  . hydrOXYzine (ATARAX/VISTARIL) 10 MG tablet TAKE 1  TABLET (10 MG TOTAL) BY MOUTH 3 (THREE) TIMES DAILY AS NEEDED. 30 tablet 2  . lurasidone (LATUDA) 20 MG TABS tablet Take 3 tablets (60 mg total) by mouth every morning. 90 tablet 2  . traZODone (DESYREL) 150 MG tablet Take 1 tablet (150 mg total) by mouth at bedtime. 90 tablet 0   No current facility-administered medications for this visit.      Neurologic: Headache: No Seizure: No Paresthesias:No  Musculoskeletal: Strength & Muscle Tone: within normal limits Gait & Station: normal Patient leans: N/A  Psychiatric Specialty Exam: ROS    Blood pressure 117/80, pulse 62, height 5' 6.75" (1.695 m), weight 81.4 kg (179 lb 6.4 oz), SpO2 97 %.Body mass index is 28.31 kg/m.  General Appearance: Casual  Eye Contact:  Fair  Speech:  normal  Volume:  normal  Mood: improved  Affect: pleasant  Thought Process:  Coherent  Orientation:  Full (Time, Place, and Person)  Thought Content:  WDL  Suicidal Thoughts:  No  Homicidal Thoughts:  No  Memory:  Immediate;   Fair Recent;   Fair Remote;   Fair  Judgement:  Fair  Insight:  Fair  Psychomotor Activity:  Normal  Concentration:  Concentration: Fair and Attention Span: Fair  Recall:  [employer] of Knowledge:Fair  Language: Fair  Akathisia:  No  Handed:  Right  AIMS (if indicated):    Assets:  Communication Skills Desire for Improvement Financial Resources/Insurance Housing Physical Health Resilience Social Support Vocational/Educational  ADL's:  Intact  Cognition: WNL  Sleep:  better     Treatment Plan Summary: Medication management   Bipolar disorder mixed episode currently Continue Latuda at 60 mg once daily . Patient to monitor for side effects.  Continue hydroxyzine at 10mg  1-3 times daily prn for anxiety Continue therapy  Insomnia Continue  trazodone at 150mg  po qhs.  Continue therapy to develop coping mechanisms to deal with stress and continue marital therapy. Return to clinic in 4 weeks  time or call  before if necessary.  Time spent was 30 minutes on this session and more than 50% of this time was spent in counseling and strategizing.  Patrick North, MD 6/18/201910:13 AM

## 2018-03-24 ENCOUNTER — Other Ambulatory Visit: Payer: Self-pay | Admitting: Psychiatry

## 2018-03-24 DIAGNOSIS — F99 Mental disorder, not otherwise specified: Principal | ICD-10-CM

## 2018-03-24 DIAGNOSIS — F5105 Insomnia due to other mental disorder: Secondary | ICD-10-CM

## 2018-04-04 NOTE — Telephone Encounter (Signed)
received a notice for a refill on trazodone pt was last seen on 03-01-18 next appt 04-12-18   traZODone (DESYREL) 150 MG tablet  Medication  Date: 11/29/2017 Department: [facility] Ordering/Authorizing: Jomarie LongsEappen, Saramma, MD  Order Providers   Prescribing Provider Encounter Provider  Jomarie LongsEappen, Saramma, MD Elvina MattesNorton, Coen Miyasato L, CMA  Outpatient Medication Detail    Disp Refills Start End   traZODone (DESYREL) 150 MG tablet 90 tablet 0 11/29/2017    Sig - Route: Take 1 tablet (150 mg total) by mouth at bedtime. - Oral   Sent to pharmacy as: traZODone (DESYREL) 150 MG tablet   E-Prescribing Status: Receipt confirmed by pharmacy (11/29/2017 12:48 PM EDT)

## 2018-04-12 ENCOUNTER — Ambulatory Visit: Payer: BLUE CROSS/BLUE SHIELD | Admitting: Psychiatry

## 2018-04-14 ENCOUNTER — Encounter: Payer: Self-pay | Admitting: Psychiatry

## 2018-04-14 ENCOUNTER — Ambulatory Visit: Payer: BLUE CROSS/BLUE SHIELD | Admitting: Psychiatry

## 2018-04-14 VITALS — BP 129/87 | HR 91 | Ht 66.75 in | Wt 180.0 lb

## 2018-04-14 DIAGNOSIS — F99 Mental disorder, not otherwise specified: Secondary | ICD-10-CM | POA: Diagnosis not present

## 2018-04-14 DIAGNOSIS — F5105 Insomnia due to other mental disorder: Secondary | ICD-10-CM

## 2018-04-14 DIAGNOSIS — F3160 Bipolar disorder, current episode mixed, unspecified: Secondary | ICD-10-CM

## 2018-04-14 MED ORDER — TRAZODONE HCL 150 MG PO TABS: 150.0000 mg | ORAL_TABLET | Freq: Every day | ORAL | 0 refills | Status: DC
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
## 2018-06-02 ENCOUNTER — Encounter: Payer: Self-pay | Admitting: Psychiatry

## 2018-06-02 ENCOUNTER — Ambulatory Visit: Payer: BC Managed Care – PPO | Admitting: Psychiatry

## 2018-06-02 ENCOUNTER — Other Ambulatory Visit: Payer: Self-pay

## 2018-06-02 DIAGNOSIS — F5105 Insomnia due to other mental disorder: Secondary | ICD-10-CM

## 2018-06-02 DIAGNOSIS — F99 Mental disorder, not otherwise specified: Secondary | ICD-10-CM | POA: Diagnosis not present

## 2018-06-02 MED ORDER — TRAZODONE HCL 150 MG PO TABS: 150.0000 mg | ORAL_TABLET | Freq: Every day | ORAL | 0 refills | Status: DC

## 2018-06-02 MED ORDER — LURASIDONE HCL 20 MG PO TABS: 60.0000 mg | ORAL_TABLET | Freq: Every morning | ORAL | 2 refills | Status: DC

## 2018-06-02 MED ORDER — HYDROXYZINE HCL 10 MG PO TABS: 10.0000 mg | ORAL_TABLET | Freq: Three times a day (TID) | ORAL | 2 refills | Status: DC | PRN

## 2018-06-02 NOTE — Progress Notes (Signed)
Psychiatric Progress Note   Patient ID: Lindsay Brown, female   DOB: 05/14/1986,Lindsay Brown 32 y.o.   MRN: [id number]   Date of Evaluation:  06/02/2018 Referral Source: self referred Chief Complaint:  Doing better Chief Complaint    Follow-up; Medication Refill     Visit Diagnosis: Mixed bipolar disorder  History of Present Illness:  Patient is a 32 year old female of Hispanic origin who presents today for a follow up of  Bipolar disorder. 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 States there is no discipline or support in her school. On the home front she reports that she still has a restraining order against her husband. States that going back home. States that financially has been difficult. Overall she has been doing well and has good support in groups and also her colleagues at work.Reports good mood and functioning.  Denies any suicidal thoughts.  Previous Psychotropic Medications: Yes   Substance Abuse History in the last 12 months:  No.  Consequences of Substance Abuse: Negative  Past Medical History:  Past Medical History:  Diagnosis Date  . Arrhythmia   . Bipolar disorder (HCC)   . Depression    History reviewed. No pertinent surgical history.  Family Psychiatric History: see below  Family History:  Family History  Problem Relation Age of Onset  . Bipolar disorder Mother   . Drug abuse Mother   . Alcohol abuse Mother   . Obesity Mother   . Skin cancer Mother   . Heart disease Mother   . Cancer - Ovarian Mother   . Prostate cancer Father   . Depression Father   . Drug abuse Father   . Alcohol abuse Father   . Thyroid cancer Sister   . Depression Sister   . Anxiety disorder Sister   . Alcohol abuse Brother   . Drug abuse Brother   . Anxiety disorder Brother     Social History:   Social History   Socioeconomic History  . Marital status: Married     Spouse name: Lindsay Brown   . Number of children: 3  . Years of education: Not on file  . Highest education level: Bachelor's degree (e.g., BA, AB, BS)  Occupational History  . Occupation: [employer]    Comment: full time  Social Needs  . Financial resource strain: Not hard at all  . Food insecurity:    Worry: Never true    Inability: Never true  . Transportation needs:    Medical: No    Non-medical: No  Tobacco Use  . Smoking status: Never Smoker  . Smokeless tobacco: Never Used  Substance and Sexual Activity  . Alcohol use: Yes    Alcohol/week: 2.0 standard drinks    Types: 2 Glasses of wine per week    Comment: not at the moment   . Drug use: No  . Sexual activity: Yes    Birth control/protection: None  Lifestyle  . Physical activity:    Days per week:  0 days    Minutes per session: 0 min  . Stress: To some extent  Relationships  . Social connections:    Talks on phone: Once a week    Gets together: Once a week    Attends religious service: Never    Active member of club or organization: No    Attends meetings of clubs or organizations: Never    Relationship status: Married  Other Topics Concern  . Not on file  Social History Narrative  . Not on file    Additional Social History: Patient is currently married and has 11-year-old twins. She also has a 19-year-old son from a previous relationship who spends half time with dad and half time with her.  Allergies:  No Known Allergies  Metabolic Disorder Labs: No results found for: HGBA1C, MPG No results found for: PROLACTIN Lab Results  Component Value Date   CHOL 147 02/06/2016   TRIG 186 (H) 02/06/2016   HDL 36 (L) 02/06/2016   LDLCALC 74 02/06/2016     Current Medications: Current Outpatient Medications  Medication Sig Dispense Refill  . azelastine (ASTELIN) 0.1 % nasal spray Place into both nostrils 2 (two) times daily. Use in each nostril as directed    . cetirizine (ZYRTEC) 1 MG/ML syrup Take by mouth  daily.    . fluticasone (FLONASE) 50 MCG/ACT nasal spray     . hydrOXYzine (ATARAX/VISTARIL) 10 MG tablet TAKE 1 TABLET (10 MG TOTAL) BY MOUTH 3 (THREE) TIMES DAILY AS NEEDED. 30 tablet 2  . hydrOXYzine (ATARAX/VISTARIL) 10 MG tablet Take 1 tablet (10 mg total) by mouth 3 (three) times daily as needed. 30 tablet 2  . lurasidone (LATUDA) 20 MG TABS tablet Take 3 tablets (60 mg total) by mouth every morning. 90 tablet 2  . traZODone (DESYREL) 150 MG tablet Take 1 tablet (150 mg total) by mouth at bedtime. 90 tablet 0   No current facility-administered medications for this visit.      Neurologic: Headache: No Seizure: No Paresthesias:No  Musculoskeletal: Strength & Muscle Tone: within normal limits Gait & Station: normal Patient leans: N/A  Psychiatric Specialty Exam: ROS    There were no vitals taken for this visit.There is no height or weight on file to calculate BMI.  General Appearance: Casual  Eye Contact:  Fair  Speech:  normal  Volume:  normal  Mood: improved  Affect: pleasant  Thought Process:  Coherent  Orientation:  Full (Time, Place, and Person)  Thought Content:  WDL  Suicidal Thoughts:  No  Homicidal Thoughts:  No  Memory:  Immediate;   Fair Recent;   Fair Remote;   Fair  Judgement:  Fair  Insight:  Fair  Psychomotor Activity:  Normal  Concentration:  Concentration: Fair and Attention Span: Fair  Recall:  [employer] of Knowledge:Fair  Language: Fair  Akathisia:  No  Handed:  Right  AIMS (if indicated):    Assets:  Communication Skills Desire for Improvement Financial Resources/Insurance Housing Physical Health Resilience Social Support Vocational/Educational  ADL's:  Intact  Cognition: WNL  Sleep:  better     Treatment Plan Summary: Medication management   Bipolar disorder mixed episode currently Continue Latuda at 60 mg once daily . Patient to monitor for side effects.  Continue hydroxyzine at 10mg  1-3 times daily prn for  anxiety Continue therapy  Insomnia Continue  trazodone at 150mg  po qhs.  Continue therapy to address current situation. Return to clinic in 4 weeks  time or call before  if necessary.  Time spent was 30 minutes on this session and more than 50% of this time was spent in counseling and strategizing.  Patrick North, MD 9/19/20194:24 PM

## 2018-07-07 ENCOUNTER — Ambulatory Visit: Payer: BC Managed Care – PPO | Admitting: Psychiatry

## 2018-07-07 ENCOUNTER — Other Ambulatory Visit: Payer: Self-pay

## 2018-07-07 ENCOUNTER — Encounter: Payer: Self-pay | Admitting: Psychiatry

## 2018-07-07 DIAGNOSIS — F99 Mental disorder, not otherwise specified: Secondary | ICD-10-CM

## 2018-07-07 DIAGNOSIS — F5105 Insomnia due to other mental disorder: Secondary | ICD-10-CM

## 2018-07-07 NOTE — Progress Notes (Signed)
Psychiatric Progress Note   Patient ID: Lindsay Brown, female   DOB: [birth year], 32 y.o.   MRN: [id number]   Date of Evaluation:  07/07/2018 Referral Source: self referred Chief Complaint:  Doing well  Visit Diagnosis: Mixed bipolar disorder  History of Present Illness:  Patient is a 31 year old female of Hispanic origin who presents today for a follow up of  Bipolar disorder. 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 Communication is improving with her husband in terms of co parenting. States they will not be getting back together.  Overall she has been doing well and has good support in groups and also her colleagues at work. Reports good mood and functioning. She has decreased the amount of trazodone to 75mg  and feels it helps. The 150mg  was too sedating.  Denies any suicidal thoughts.  Previous Psychotropic Medications: Yes   Substance Abuse History in the last 12 months:  No.  Consequences of Substance Abuse: Negative  Past Medical History:  Past Medical History:  Diagnosis Date  . Arrhythmia   . Bipolar disorder (HCC)   . Depression    No past surgical history on file.  Family Psychiatric History: see below  Family History:  Family History  Problem Relation Age of Onset  . Bipolar disorder Mother   . Drug abuse Mother   . Alcohol abuse Mother   . Obesity Mother   . Skin cancer Mother   . Heart disease Mother   . Cancer - Ovarian Mother   . Prostate cancer Father   . Depression Father   . Drug abuse Father   . Alcohol abuse Father   . Thyroid cancer Sister   . Depression Sister   . Anxiety disorder Sister   . Alcohol abuse Brother   . Drug abuse Brother   . Anxiety disorder Brother     Social History:   Social History   Socioeconomic History  . Marital status: Married    Spouse name: Jonny Ruiz   . Number of children: 3  . Years of education: Not on file   . Highest education level: Bachelor's degree (e.g., BA, AB, BS)  Occupational History  . Occupation: [employer]    Comment: full time  Social Needs  . Financial resource strain: Not hard at all  . Food insecurity:    Worry: Never true    Inability: Never true  . Transportation needs:    Medical: No    Non-medical: No  Tobacco Use  . Smoking status: Never Smoker  . Smokeless tobacco: Never Used  Substance and Sexual Activity  . Alcohol use: Yes    Alcohol/week: 2.0 standard drinks    Types: 2 Glasses of wine per week    Comment: not at the moment   . Drug use: No  . Sexual activity: Yes    Birth control/protection: None  Lifestyle  . Physical activity:    Days per week: 0 days    Minutes per session: 0 min  .  Stress: To some extent  Relationships  . Social connections:    Talks on phone: Once a week    Gets together: Once a week    Attends religious service: Never    Active member of club or organization: No    Attends meetings of clubs or organizations: Never    Relationship status: Married  Other Topics Concern  . Not on file  Social History Narrative  . Not on file    Additional Social History: Patient is currently married and has 34-year-old twins. She also has a 17-year-old son from a previous relationship who spends half time with dad and half time with her.  Allergies:  No Known Allergies  Metabolic Disorder Labs: No results found for: HGBA1C, MPG No results found for: PROLACTIN Lab Results  Component Value Date   CHOL 147 02/06/2016   TRIG 186 (H) 02/06/2016   HDL 36 (L) 02/06/2016   LDLCALC 74 02/06/2016     Current Medications: Current Outpatient Medications  Medication Sig Dispense Refill  . azelastine (ASTELIN) 0.1 % nasal spray Place into both nostrils 2 (two) times daily. Use in each nostril as directed    . cetirizine (ZYRTEC) 1 MG/ML syrup Take by mouth daily.    . fluticasone (FLONASE) 50 MCG/ACT nasal spray     . hydrOXYzine  (ATARAX/VISTARIL) 10 MG tablet TAKE 1 TABLET (10 MG TOTAL) BY MOUTH 3 (THREE) TIMES DAILY AS NEEDED. 30 tablet 2  . hydrOXYzine (ATARAX/VISTARIL) 10 MG tablet Take 1 tablet (10 mg total) by mouth 3 (three) times daily as needed. 30 tablet 2  . lurasidone (LATUDA) 20 MG TABS tablet Take 3 tablets (60 mg total) by mouth every morning. 90 tablet 2  . traZODone (DESYREL) 150 MG tablet Take 1 tablet (150 mg total) by mouth at bedtime. 90 tablet 0   No current facility-administered medications for this visit.      Neurologic: Headache: No Seizure: No Paresthesias:No  Musculoskeletal: Strength & Muscle Tone: within normal limits Gait & Station: normal Patient leans: N/A  Psychiatric Specialty Exam: ROS    There were no vitals taken for this visit.There is no height or weight on file to calculate BMI.  General Appearance: Casual  Eye Contact:  Fair  Speech:  normal  Volume:  normal  Mood: improved  Affect: pleasant  Thought Process:  Coherent  Orientation:  Full (Time, Place, and Person)  Thought Content:  WDL  Suicidal Thoughts:  No  Homicidal Thoughts:  No  Memory:  Immediate;   Fair Recent;   Fair Remote;   Fair  Judgement:  Fair  Insight:  Fair  Psychomotor Activity:  Normal  Concentration:  Concentration: Fair and Attention Span: Fair  Recall:  [employer] of Knowledge:Fair  Language: Fair  Akathisia:  No  Handed:  Right  AIMS (if indicated):    Assets:  Communication Skills Desire for Improvement Financial Resources/Insurance Housing Physical Health Resilience Social Support Vocational/Educational  ADL's:  Intact  Cognition: WNL  Sleep:  better     Treatment Plan Summary: Medication management   Bipolar disorder mixed episode currently Continue Latuda at 60 mg once daily . Patient to monitor for side effects.  Continue hydroxyzine at 10mg  1-3 times daily prn for anxiety Continue therapy  Insomnia Continue  trazodone at 150mg  po qhs.  Continue  therapy to address current situation. Return to clinic in 4 weeks  time or call before if necessary.  Time spent was 30 minutes on this session and  more than 50% of this time was spent in counseling and strategizing.  Patrick North, MD 10/24/20194:07 PM

## 2018-07-14 ENCOUNTER — Other Ambulatory Visit: Payer: Self-pay | Admitting: Psychiatry

## 2018-07-14 ENCOUNTER — Telehealth: Payer: Self-pay

## 2018-07-14 MED ORDER — TRAZODONE HCL 50 MG PO TABS: 50.0000 mg | ORAL_TABLET | Freq: Every day | ORAL | 1 refills | Status: DC

## 2018-07-14 NOTE — Telephone Encounter (Signed)
ok 

## 2018-07-14 NOTE — Telephone Encounter (Signed)
Faxed and confirmed rx for trazodone 50mg  id # [id number] order # [id number]

## 2018-07-14 NOTE — Telephone Encounter (Signed)
pt needs refills of trazodone pt has appt for december

## 2018-07-18 ENCOUNTER — Encounter: Payer: Self-pay | Admitting: Psychiatry

## 2018-08-06 ENCOUNTER — Telehealth (HOSPITAL_COMMUNITY): Payer: Self-pay

## 2018-08-06 NOTE — Telephone Encounter (Signed)
traZODone (DESYREL) 50 MG tablet  Medication  Date: 07/14/2018 Department: [facility] Ordering/Authorizing: Patrick Northavi, Himabindu, MD  Order Providers   Prescribing Provider Encounter Provider  Patrick Northavi, Himabindu, MD Patrick Northavi, Himabindu, MD  Outpatient Medication Detail    Disp Refills Start End   traZODone (DESYREL) 50 MG tablet 30 tablet 1 07/14/2018    Sig - Route: Take 1 tablet (50 mg total) by mouth at bedtime. - Oral   Class: Print    received a faxed requesting a 90 day supply of the trazodone

## 2018-08-08 ENCOUNTER — Telehealth: Payer: Self-pay | Admitting: Psychiatry

## 2018-08-08 ENCOUNTER — Other Ambulatory Visit: Payer: Self-pay | Admitting: Psychiatry

## 2018-08-08 MED ORDER — TRAZODONE HCL 50 MG PO TABS: 50.0000 mg | ORAL_TABLET | Freq: Every day | ORAL | 1 refills | Status: DC

## 2018-08-08 NOTE — Telephone Encounter (Signed)
Received request to refill Trazodone. However it looks like Dr.Ravi already printed out Trazodone script - today - dated 08/08/2018. Will defer to Dr.Ravi.

## 2018-08-08 NOTE — Telephone Encounter (Signed)
Ok

## 2018-08-08 NOTE — Telephone Encounter (Signed)
faxed and confirmed rx for trazodone 50mg  id #[id number] order # [id number]

## 2018-08-18 ENCOUNTER — Ambulatory Visit: Payer: BC Managed Care – PPO | Admitting: Psychiatry

## 2018-08-23 ENCOUNTER — Ambulatory Visit: Payer: BC Managed Care – PPO | Admitting: Psychiatry

## 2018-08-23 ENCOUNTER — Other Ambulatory Visit: Payer: Self-pay

## 2018-08-23 ENCOUNTER — Encounter: Payer: Self-pay | Admitting: Psychiatry

## 2018-08-23 VITALS — BP 144/88 | HR 84 | Temp 98.1°F | Wt 177.6 lb

## 2018-08-23 DIAGNOSIS — F3160 Bipolar disorder, current episode mixed, unspecified: Secondary | ICD-10-CM

## 2018-08-23 NOTE — Progress Notes (Signed)
Psychiatric Progress Note   Patient ID: Lindsay Brown, female   DOB: 1986-01-22, 32 y.o.   MRN: [id number]   Date of Evaluation:  08/23/2018 Referral Source: self referred Chief Complaint:  Doing well Chief Complaint    Follow-up; Medication Refill     Visit Diagnosis: Mixed bipolar disorder  History of Present Illness:  Patient is a 32 year-old female of Hispanic origin who presents today for a follow up of  Bipolar disorder. She reports that she is doing quite well.  She has lost 55 pounds since the beginning of the year.  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  She is managing with the kids and they see their father every week on certain Saturdays.  States that the kids are also adjusting to the schedule.  Overall she feels quite well and is happy that she has made this change in her life.  She still does not get child support and she is working on these issues with the husband who she has separated from.  Sleeping well and eating well.  She has been going to the gym regularly.  Her relationship with her sister has improved to and that she is enjoying time with her.  Denies any manic symptoms or mood symptoms.  Denies any suicidal thoughts.   She reports that her insurance is not covering the Jordan.  We discussed that for now she will get samples of the Latuda for the next 2-1/2 months.  She will follow-up with Dr. Lenis Dickinson in 2 months time since this clinician will be away.  She can then continue to obtain the refills or change of medications. Denies any suicidal thoughts.  Previous Psychotropic Medications: Yes   Substance Abuse History in the last 12 months:  No.  Consequences of Substance Abuse: Negative  Past Medical History:  Past Medical History:  Diagnosis Date  . Arrhythmia   . Bipolar disorder (HCC)   . Depression    History  reviewed. No pertinent surgical history.  Family Psychiatric History: see below  Family History:  Family History  Problem Relation Age of Onset  . Bipolar disorder Mother   . Drug abuse Mother   . Alcohol abuse Mother   . Obesity Mother   . Skin cancer Mother   . Heart disease Mother   . Cancer - Ovarian Mother   . Prostate cancer Father   . Depression Father   . Drug abuse Father   . Alcohol abuse Father   . Thyroid cancer Sister   . Depression Sister   . Anxiety disorder Sister   . Alcohol abuse Brother   . Drug abuse Brother   . Anxiety disorder Brother     Social History:   Social History   Socioeconomic History  . Marital status: Married    Spouse name: Jonny Ruiz   . Number of children: 3  . Years of education: Not on file  . Highest education level:  Bachelor's degree (e.g., BA, AB, BS)  Occupational History  . Occupation: [employer]    Comment: full time  Social Needs  . Financial resource strain: Not hard at all  . Food insecurity:    Worry: Never true    Inability: Never true  . Transportation needs:    Medical: No    Non-medical: No  Tobacco Use  . Smoking status: Never Smoker  . Smokeless tobacco: Never Used  Substance and Sexual Activity  . Alcohol use: Yes    Alcohol/week: 2.0 standard drinks    Types: 2 Glasses of wine per week    Comment: not at the moment   . Drug use: No  . Sexual activity: Yes    Birth control/protection: None  Lifestyle  . Physical activity:    Days per week: 0 days    Minutes per session: 0 min  . Stress: To some extent  Relationships  . Social connections:    Talks on phone: Once a week    Gets together: Once a week    Attends religious service: Never    Active member of club or organization: No    Attends meetings of clubs or organizations: Never    Relationship status: Married  Other Topics Concern  . Not on file  Social History Narrative  . Not on file    Additional Social History: Patient is  currently married and has 61-year-old twins. She also has a 91-year-old son from a previous relationship who spends half time with dad and half time with her.  Allergies:  No Known Allergies  Metabolic Disorder Labs: No results found for: HGBA1C, MPG No results found for: PROLACTIN Lab Results  Component Value Date   CHOL 147 02/06/2016   TRIG 186 (H) 02/06/2016   HDL 36 (L) 02/06/2016   LDLCALC 74 02/06/2016     Current Medications: Current Outpatient Medications  Medication Sig Dispense Refill  . azelastine (ASTELIN) 0.1 % nasal spray Place into both nostrils 2 (two) times daily. Use in each nostril as directed    . cetirizine (ZYRTEC) 1 MG/ML syrup Take by mouth daily.    . fluticasone (FLONASE) 50 MCG/ACT nasal spray     . hydrOXYzine (ATARAX/VISTARIL) 10 MG tablet TAKE 1 TABLET (10 MG TOTAL) BY MOUTH 3 (THREE) TIMES DAILY AS NEEDED. 30 tablet 2  . hydrOXYzine (ATARAX/VISTARIL) 10 MG tablet Take 1 tablet (10 mg total) by mouth 3 (three) times daily as needed. 30 tablet 2  . lurasidone (LATUDA) 20 MG TABS tablet Take 3 tablets (60 mg total) by mouth every morning. 90 tablet 2  . traZODone (DESYREL) 50 MG tablet Take 1 tablet (50 mg total) by mouth at bedtime. 90 tablet 1   No current facility-administered medications for this visit.      Neurologic: Headache: No Seizure: No Paresthesias:No  Musculoskeletal: Strength & Muscle Tone: within normal limits Gait & Station: normal Patient leans: N/A  Psychiatric Specialty Exam: ROS    Blood pressure (!) 144/88, pulse 84, temperature 98.1 F (36.7 C), temperature source Oral, weight 177 lb 9.6 oz (80.6 kg).Body mass index is 28.02 kg/m.   General Appearance: Casual  Eye Contact:  Fair  Speech:  normal  Volume:  normal  Mood: improved  Affect: pleasant  Thought Process:  Coherent  Orientation:  Full (Time, Place, and Person)  Thought Content:  WDL  Suicidal Thoughts:  No  Homicidal Thoughts:  No  Memory:  Immediate;    Fair Recent;  Fair Remote;   Fair  Judgement:  Fair  Insight:  Fair  Psychomotor Activity:  Normal  Concentration:  Concentration: Fair and Attention Span: Fair  Recall:  FiservFair  Fund of Knowledge:Fair  Language: Fair  Akathisia:  No  Handed:  Right  AIMS (if indicated):    Assets:  Communication Skills Desire for Improvement Financial Resources/Insurance Housing Physical Health Resilience Social Support Vocational/Educational  ADL's:  Intact  Cognition: WNL  Sleep:  better     Treatment Plan Summary: Medication management   Bipolar disorder mixed episode currently Continue Latuda at 60 mg once daily . Patient to monitor for side effects.  Continue hydroxyzine at 10mg  1-3 times daily prn for anxiety-patient has not needed this medication in 4 months. Continue therapy  Insomnia Continue  trazodone at 75 mg po qhs prn as needed.  Continue therapy to address current situation. Return to clinic in 2 months  time or call before if necessary.  Patient will transition to Dr. Elna BreslowEappen next visit since this clinician will be away.  She is aware of this.  Time spent was 30 minutes on this session and more than 50% of this time was spent in counseling and strategizing.  Patrick NorthHimabindu Amarise Lillo, MD 12/10/201910:30 AM

## 2018-08-25 ENCOUNTER — Telehealth: Payer: Self-pay

## 2018-08-25 NOTE — Telephone Encounter (Signed)
latuda was denied due to patient need to tried and failed aripiprazole, olanzapine, paliperidone, quetiapine, risperidone or ziprasidone

## 2018-10-26 ENCOUNTER — Ambulatory Visit: Payer: BC Managed Care – PPO | Admitting: Psychiatry

## 2018-11-01 ENCOUNTER — Ambulatory Visit: Payer: BC Managed Care – PPO | Admitting: Psychiatry

## 2018-11-01 ENCOUNTER — Encounter: Payer: Self-pay | Admitting: Psychiatry

## 2018-11-01 ENCOUNTER — Other Ambulatory Visit: Payer: Self-pay

## 2018-11-01 VITALS — BP 130/80 | HR 90 | Temp 98.9°F | Wt 175.8 lb

## 2018-11-01 DIAGNOSIS — F3160 Bipolar disorder, current episode mixed, unspecified: Secondary | ICD-10-CM

## 2018-11-01 DIAGNOSIS — F5105 Insomnia due to other mental disorder: Secondary | ICD-10-CM | POA: Diagnosis not present

## 2018-11-01 MED ORDER — LURASIDONE HCL 20 MG PO TABS: 60.0000 mg | ORAL_TABLET | Freq: Every morning | ORAL | 0 refills | Status: DC

## 2018-11-01 NOTE — Progress Notes (Signed)
BH MD OP Progress Note  11/01/2018 5:25 PM Lindsay Brown  MRN:  [id number]  Chief Complaint: ' I am here for follow up." Chief Complaint    Follow-up     HPI: Lindsay RasDolores is a 33 yr old Hispanic female,single,employed , lives in [place] , has a history of bipolar disorder, presented to clinic today for a follow-up visit.  Patient used to follow-up with Dr. Daleen Boavi here in clinic.  I have reviewed progress note in [facility] per Dr. Daleen Boavi dated 02/05/2016 to 08/23/2018.  Most recent progress note dated 08/23/2018- per progress note-' patient with bipolar disorder, insomnia was advised to continue Latuda 60 mg and trazodone.  Patient advised to follow-up in clinic in 2 months.'  Patient today reports that she is currently doing well on the Latuda 60 mg.  She however reports she has been having some insurance problem and since last August they are not covering her Latuda anymore.  She reports it cost her a lot now however Dr. Daleen Boavi has been giving her samples.  She reports since her mood symptoms are so stable she hates to stop taking the JordanLatuda and start something new at this time.  Patient reports sleep is good.  Patient denies appetite is fair.  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  Patient continues to have good support system from her dad and her stepmom.  She denies any other concerns at this time. Visit Diagnosis:    ICD-10-CM   1. 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 Insomnia due to mental condition F51.05     Past Psychiatric History: Patient with history of bipolar disorder, insomnia.  Patient denies suicide attempts.  Patient denies past inpatient mental health admissions.  Past Medical History:  Past Medical History:  Diagnosis Date  . Arrhythmia   . Bipolar disorder (HCC)   . Depression    History reviewed. No pertinent surgical history.  Family Psychiatric History: Mother-bipolar disorder, alcohol use  disorder. Family History:  Family History  Problem Relation Age of Onset  . Bipolar disorder Mother   . Drug abuse Mother   . Alcohol abuse Mother   . Obesity Mother   . Skin cancer Mother   . Heart disease Mother   . Cancer - Ovarian Mother   . Prostate cancer Father   . Depression Father   . Drug abuse Father   . Alcohol abuse Father   . Thyroid cancer Sister   . Depression Sister   . Anxiety disorder Sister   . Alcohol abuse Brother   . Drug abuse Brother   . 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 Lives in [place]. Patient has 3 children 63( 12 y old son and 444 yr old twins ) Social History   Socioeconomic History  . Marital status: Single    Spouse name: Not on file  . Number of children: 3  . Years of education: Not on file  . Highest education level: Bachelor's degree (e.g., BA, AB, BS)  Occupational History  . Occupation: [employer]    Comment: full time  Social Needs  . Financial resource strain: Not hard at all  . Food insecurity:    Worry: Never true    Inability: Never true  . Transportation needs:    Medical: No    Non-medical: No  Tobacco Use  .  Smoking status: Never Smoker  . Smokeless tobacco: Never Used  Substance and Sexual Activity  . Alcohol use: Yes    Alcohol/week: 2.0 standard drinks    Types: 2 Glasses of wine per week    Comment: not at the moment   . Drug use: No  . Sexual activity: Yes    Birth control/protection: None  Lifestyle  . Physical activity:    Days per week: 0 days    Minutes per session: 0 min  . Stress: To some extent  Relationships  . Social connections:    Talks on phone: Once a week    Gets together: Once a week    Attends religious service: Never    Active member of club or organization: No    Attends meetings of clubs or organizations: Never    Relationship status: Married  Other Topics Concern  . Not on file  Social History Narrative  . Not on file     Allergies: No Known Allergies  Metabolic Disorder Labs: No results found for: HGBA1C, MPG No results found for: PROLACTIN Lab Results  Component Value Date   CHOL 147 02/06/2016   TRIG 186 (H) 02/06/2016   HDL 36 (L) 02/06/2016   LDLCALC 74 02/06/2016   Lab Results  Component Value Date   TSH 0.780 02/06/2016    Therapeutic Level Labs: No results found for: LITHIUM No results found for: VALPROATE No components found for:  CBMZ  Current Medications: Current Outpatient Medications  Medication Sig Dispense Refill  . azelastine (ASTELIN) 0.1 % nasal spray Place into both nostrils 2 (two) times daily. Use in each nostril as directed    . fluticasone (FLONASE) 50 MCG/ACT nasal spray     . hydrOXYzine (ATARAX/VISTARIL) 10 MG tablet Take 1 tablet (10 mg total) by mouth 3 (three) times daily as needed. 30 tablet 2  . lurasidone (LATUDA) 20 MG TABS tablet Take 3 tablets (60 mg total) by mouth every morning. 30 tablet 0  . traZODone (DESYREL) 50 MG tablet Take 1 tablet (50 mg total) by mouth at bedtime. 90 tablet 1   No current facility-administered medications for this visit.      Musculoskeletal: Strength & Muscle Tone: within normal limits Gait & Station: normal Patient leans: N/A  Psychiatric Specialty Exam: Review of Systems  Psychiatric/Behavioral: Negative for depression. The patient is not nervous/anxious.   All other systems reviewed and are negative.   Blood pressure 130/80, pulse 90, temperature 98.9 F (37.2 C), temperature source Oral, weight 175 lb 12.8 oz (79.7 kg).Body mass index is 27.74 kg/m.  General Appearance: Casual  Eye Contact:  Fair  Speech:  Clear and Coherent  Volume:  Normal  Mood:  Euthymic  Affect:  Appropriate  Thought Process:  Goal Directed and Descriptions of Associations: Intact  Orientation:  Full (Time, Place, and Person)  Thought Content: Logical   Suicidal Thoughts:  No  Homicidal Thoughts:  No  Memory:  Immediate;    Fair Recent;   Fair Remote;   Fair  Judgement:  Fair  Insight:  Fair  Psychomotor Activity:  Normal  Concentration:  Concentration: Fair and Attention Span: Fair  Recall:  [employer] of Knowledge: Fair  Language: Fair  Akathisia:  No  Handed:  Right  AIMS (if indicated): denies tremors, rigidity,stiffness  Assets:  Communication Skills Desire for Improvement Social Support  ADL's:  Intact  Cognition: WNL  Sleep:  Fair   Screenings:   Assessment and Plan: Lindsay Brown is  a 33 yr old Hispanic female, single, employed, lives in [place], has a history of bipolar disorder, insomnia, presented to clinic today for a follow-up visit.  Patient is currently doing well on the Jordan.  She however is having health insurance coverage problems.  Hence discussed that she will be provided samples.  We will also work on possible patient assistance program.  Plan as noted below.  Plan Bipolar disorder mixed-stable Continue Latuda 60 mg p.o. daily. Continue hydroxyzine 10 mg 3 times daily as needed for anxiety symptoms  For insomnia-stable Trazodone 50 mg at bedtime as needed.  I have reviewed medical records in [facility] per Dr. Daleen Bo as summarized above.  Patient provided with samples today.  Will look into the possibility of patient assistance program.  Patient has been doing so well on the Latuda at this time.  Follow-up in clinic in 2 months with Dr. Daleen Bo.  I have spent atleast 15 minutes face to face with patient today. More than 50 % of the time was spent for psychoeducation and supportive psychotherapy and care coordination.  This note was generated in part or whole with voice recognition software. Voice recognition is usually quite accurate but there are transcription errors that can and very often do occur. I apologize for any typographical errors that were not detected and corrected.       Jomarie Longs, MD 11/01/2018, 5:25 PM

## 2018-12-15 ENCOUNTER — Other Ambulatory Visit: Payer: Self-pay

## 2018-12-15 ENCOUNTER — Ambulatory Visit (INDEPENDENT_AMBULATORY_CARE_PROVIDER_SITE_OTHER): Payer: BC Managed Care – PPO | Admitting: Psychiatry

## 2018-12-15 ENCOUNTER — Encounter: Payer: Self-pay | Admitting: Psychiatry

## 2018-12-15 DIAGNOSIS — F3160 Bipolar disorder, current episode mixed, unspecified: Secondary | ICD-10-CM | POA: Diagnosis not present

## 2018-12-15 DIAGNOSIS — F5105 Insomnia due to other mental disorder: Secondary | ICD-10-CM | POA: Diagnosis not present

## 2018-12-15 NOTE — Progress Notes (Signed)
TC 12-15-18 pt medical and surgical hx was reviewed with no changes, pt allergies was reviewed with no changes, pt mediations and pharmacy was reviewed and updated. Pt vitals was not taken due to this was a phone consult. 

## 2018-12-15 NOTE — Progress Notes (Signed)
BH MD OP Progress Note  12/15/2018 3:10 PM Lindsay Brown  MRN:  [id number]  Chief Complaint: doing ok. Chief Complaint    Follow-up; Stress     HPI: Lindsay Brown is a 33 yr old Hispanic female,single,employed , lives in [place] , has a history of bipolar disorder, was followed by phone consult. Due to the covid 19 situation. Patient today reports that she is currently doing well on the Latuda 60 mg.  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 States she is struggling with getting everything done but doing it. Her relationship with her boyfriend is going well. Patient reports sleep is good.  Patient denies appetite is fair.  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  Patient continues to have good support system from her dad and her stepmom.  She denies any other concerns at this time.  Visit Diagnosis:    ICD-10-CM   1. 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 Insomnia due to mental condition F51.05     Past Psychiatric History: Patient with history of bipolar disorder, insomnia.  Patient denies suicide attempts.  Patient denies past inpatient mental health admissions.  Past Medical History:  Past Medical History:  Diagnosis Date  . Arrhythmia   . Bipolar disorder (HCC)   . Depression    History reviewed. No pertinent surgical history.  Family Psychiatric History: Mother-bipolar disorder, alcohol use disorder. Family History:  Family History  Problem Relation Age of Onset  . Bipolar disorder Mother   . Drug abuse Mother   . Alcohol abuse Mother   . Obesity Mother   . Skin cancer Mother   . Heart disease Mother   . Cancer - Ovarian Mother   . Prostate cancer Father   . Depression Father   . Drug abuse Father   . Alcohol abuse Father   . Thyroid cancer Sister   . Depression Sister   . Anxiety disorder Sister   . Alcohol abuse Brother   . Drug abuse Brother   . 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 Patient has 3 children ( 24 y old son and 41 yr old twins ) Social History   Socioeconomic History  . Marital status: Single    Spouse name: Not on file  . Number of children: 3  . Years of education: Not on file  . Highest education level: Bachelor's degree (e.g., BA, AB, BS)  Occupational History  . Occupation: [employer]    Comment: full time  Social Needs  . Financial resource strain: Not hard at all  . Food insecurity:    Worry: Never true    Inability: Never true  . Transportation needs:    Medical: No    Non-medical: No  Tobacco Use  . Smoking status: Never Smoker  . Smokeless tobacco: Never Used  Substance and Sexual Activity  . Alcohol use: Yes    Alcohol/week: 2.0 standard drinks    Types: 2 Glasses of wine per week    Comment: not at the moment   . Drug use: No  . Sexual activity: Yes    Birth control/protection: None  Lifestyle  . Physical activity:    Days per week: 0 days    Minutes per session: 0 min  . Stress: To some extent  Relationships  .  Social connections:    Talks on phone: Once a week    Gets together: Once a week    Attends religious service: Never    Active member of club or organization: No    Attends meetings of clubs or organizations: Never    Relationship status: Married  Other Topics Concern  . Not on file  Social History Narrative  . Not on file    Allergies: No Known Allergies  Metabolic Disorder Labs: No results found for: HGBA1C, MPG No results found for: PROLACTIN Lab Results  Component Value Date   CHOL 147 02/06/2016   TRIG 186 (H) 02/06/2016   HDL 36 (L) 02/06/2016   LDLCALC 74 02/06/2016   Lab Results  Component Value Date   TSH 0.780 02/06/2016    Therapeutic Level Labs: No results found for: LITHIUM No results found for: VALPROATE No components found for:  CBMZ  Current Medications: Current  Outpatient Medications  Medication Sig Dispense Refill  . lurasidone (LATUDA) 20 MG TABS tablet Take 3 tablets (60 mg total) by mouth every morning. 30 tablet 0  . traZODone (DESYREL) 50 MG tablet Take 1 tablet (50 mg total) by mouth at bedtime. 90 tablet 1   No current facility-administered medications for this visit.      Musculoskeletal: Strength & Muscle Tone: within normal limits Gait & Station: normal Patient leans: N/A  Psychiatric Specialty Exam: Review of Systems  Psychiatric/Behavioral: Negative for depression. The patient is not nervous/anxious.   All other systems reviewed and are negative.   There were no vitals taken for this visit.There is no height or weight on file to calculate BMI.  General Appearance: Casual  Eye Contact:  Fair  Speech:  Clear and Coherent  Volume:  Normal  Mood:  Euthymic  Affect:  Appropriate  Thought Process:  Goal Directed and Descriptions of Associations: Intact  Orientation:  Full (Time, Place, and Person)  Thought Content: Logical   Suicidal Thoughts:  No  Homicidal Thoughts:  No  Memory:  Immediate;   Fair Recent;   Fair Remote;   Fair  Judgement:  Fair  Insight:  Fair  Psychomotor Activity:  Normal  Concentration:  Concentration: Fair and Attention Span: Fair  Recall:  [employer] of Knowledge: Fair  Language: Fair  Akathisia:  No  Handed:  Right  AIMS (if indicated): denies tremors, rigidity,stiffness  Assets:  Communication Skills Desire for Improvement Social Support  ADL's:  Intact  Cognition: WNL  Sleep:  Fair   Screenings:   Assessment and Plan: Lindsay Brown is a 33 yr old Hispanic female, single, employed, lives in [place], has a history of bipolar disorder, insomnia, presented to clinic today for a follow-up visit.  Patient is currently doing well on the Jordan.  She however is having health insurance coverage problems.  Hence discussed that she will be provided samples.  We will also work on possible patient  assistance program.  Plan as noted below.  Plan Bipolar disorder mixed-stable Decrease Latuda to 40 mg p.o. daily for 1 week, then to 20mg  daily for 1 week and then stop. Start Abilify at 5mg  po qd daily. Continue hydroxyzine 10 mg 3 times daily as needed for anxiety symptoms  For insomnia-stable Trazodone 50 mg at bedtime as needed.  RTC in 2 weeks.  This note was generated in part or whole with voice recognition software. Voice recognition is usually quite accurate but there are transcription errors that can and very often do occur. I apologize  for any typographical errors that were not detected and corrected.    Patrick North, MD 12/15/2018, 3:10 PM

## 2018-12-26 ENCOUNTER — Encounter: Payer: Self-pay | Admitting: Psychiatry

## 2018-12-27 ENCOUNTER — Ambulatory Visit: Payer: BC Managed Care – PPO | Admitting: Psychiatry

## 2019-01-03 ENCOUNTER — Other Ambulatory Visit: Payer: Self-pay

## 2019-01-03 ENCOUNTER — Ambulatory Visit (INDEPENDENT_AMBULATORY_CARE_PROVIDER_SITE_OTHER): Payer: BC Managed Care – PPO | Admitting: Psychiatry

## 2019-01-03 ENCOUNTER — Encounter: Payer: Self-pay | Admitting: Psychiatry

## 2019-01-03 DIAGNOSIS — F5105 Insomnia due to other mental disorder: Secondary | ICD-10-CM

## 2019-01-03 DIAGNOSIS — F3160 Bipolar disorder, current episode mixed, unspecified: Secondary | ICD-10-CM | POA: Diagnosis not present

## 2019-01-03 MED ORDER — ARIPIPRAZOLE 10 MG PO TABS: 10.0000 mg | ORAL_TABLET | Freq: Every day | ORAL | 0 refills | Status: DC

## 2019-01-03 NOTE — Progress Notes (Signed)
TC on  01-03-19 @11 :55 spoke with patient reviewed patient allergies with no changes. Reviewed the medical and surgical hx with no changes.  Pt medications and pharmacy were reviewed with no changes. No vitals taken because this is a phone consult.

## 2019-01-03 NOTE — Progress Notes (Signed)
Virtual Visit via Telephone Note  I connected with [name] on 01/03/19 at  4:15 PM EDT by telephone and verified that I am speaking with the correct person using two identifiers.   I discussed the limitations, risks, security and privacy concerns of performing an evaluation and management service by telephone and the availability of in person appointments. I also discussed with the patient that there may be a patient responsible charge related to this service. The patient expressed understanding and agreed to proceed.    I discussed the assessment and treatment plan with the patient. The patient was provided an opportunity to ask questions and all were answered. The patient agreed with the plan and demonstrated an understanding of the instructions.   The patient was advised to call back or seek an in-person evaluation if the symptoms worsen or if the condition fails to improve as anticipated.   BH MD OP Progress Note  01/03/2019 4:36 PM Talisha Erby  MRN:  [id number]  Chief Complaint:  Chief Complaint    Follow-up     HPI: Anwita is a 33 year old Hispanic female, single, employed, lives in [place], has a history of bipolar disorder, was evaluated by phone.  Patient has a history of bipolar disorder and insomnia.  Patient today reports that she is currently working remotely from home.  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  She reports its a complicated situation since only 10% of her students are able to participate.  She reports she is trying to do the best she can.  She also has her own children at home and is trying to support them.  Patient reports she is currently doing okay on the Abilify.  She was taken off of the Latuda and started on Abilify last visit by Dr. Daleen Bo.  Her last appointment with Dr. Daleen Bo was on 12/15/2018.  Patient reports she gradually weaned herself off of the [place] and her last dosage was 2 days ago.  So far she has not noticed any  significant mood lability.  She feels as though her mood symptoms are currently stable.  She is tolerating the Abilify well.  She reports sleep is good.  She takes trazodone on and off when she needs it.  She denies any suicidal thoughts.  She denies any homicidality or perceptual disturbances.  Patient denies any other concerns today.   Visit Diagnosis:    ICD-10-CM   1. 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 Insomnia due to mental condition F51.05     Past Psychiatric History: Patient with history of bipolar disorder.  Patient denies history of suicide attempts.  Patient denies any inpatient mental health admissions.  Past Medical History:  Past Medical History:  Diagnosis Date  . Arrhythmia   . Bipolar disorder (HCC)   . Depression    History reviewed. No pertinent surgical history.  Family Psychiatric History: Mother-bipolar disorder, alcohol use disorder.  Family History:  Family History  Problem Relation Age of Onset  . Bipolar disorder Mother   . Drug abuse Mother   . Alcohol abuse Mother   . Obesity Mother   . Skin cancer Mother   . Heart disease Mother   . Cancer - Ovarian Mother   . Prostate cancer Father   . Depression Father   . Drug abuse Father   . Alcohol abuse Father   . Thyroid cancer Sister   . Depression Sister   .  Anxiety disorder Sister   . Alcohol abuse Brother   . Drug abuse Brother   . Anxiety disorder Brother     Social History: Works as a Runner, broadcasting/film/video.  She is single.  She lives in [place].  She has 3 children, 20 year old son, 82-year-old twins. Social History   Socioeconomic History  . Marital status: Single    Spouse name: Not on file  . Number of children: 3  . Years of education: Not on file  . Highest education level: Bachelor's degree (e.g., BA, AB, BS)  Occupational History  . Occupation: [employer]    Comment: full time  Social Needs  . Financial resource strain:  Not hard at all  . Food insecurity:    Worry: Never true    Inability: Never true  . Transportation needs:    Medical: No    Non-medical: No  Tobacco Use  . Smoking status: Never Smoker  . Smokeless tobacco: Never Used  Substance and Sexual Activity  . Alcohol use: Yes    Alcohol/week: 2.0 standard drinks    Types: 2 Glasses of wine per week    Comment: not at the moment   . Drug use: No  . Sexual activity: Yes    Birth control/protection: None  Lifestyle  . Physical activity:    Days per week: 0 days    Minutes per session: 0 min  . Stress: To some extent  Relationships  . Social connections:    Talks on phone: Once a week    Gets together: Once a week    Attends religious service: Never    Active member of club or organization: No    Attends meetings of clubs or organizations: Never    Relationship status: Married  Other Topics Concern  . Not on file  Social History Narrative  . Not on file    Allergies: No Known Allergies  Metabolic Disorder Labs: No results found for: HGBA1C, MPG No results found for: PROLACTIN Lab Results  Component Value Date   CHOL 147 02/06/2016   TRIG 186 (H) 02/06/2016   HDL 36 (L) 02/06/2016   LDLCALC 74 02/06/2016   Lab Results  Component Value Date   TSH 0.780 02/06/2016    Therapeutic Level Labs: No results found for: LITHIUM No results found for: VALPROATE No components found for:  CBMZ  Current Medications: Current Outpatient Medications  Medication Sig Dispense Refill  . ARIPiprazole (ABILIFY) 10 MG tablet Take 1 tablet (10 mg total) by mouth daily. For mood 90 tablet 0  . traZODone (DESYREL) 50 MG tablet Take 1 tablet (50 mg total) by mouth at bedtime. 90 tablet 1   No current facility-administered medications for this visit.      Musculoskeletal: Strength & Muscle Tone: UTA Gait & Station: UTA Patient leans: N/A  Psychiatric Specialty Exam: Review of Systems  Psychiatric/Behavioral: The patient is  nervous/anxious.   All other systems reviewed and are negative.   There were no vitals taken for this visit.There is no height or weight on file to calculate BMI.  General Appearance: UTA  Eye Contact:  UTA  Speech:  Normal Rate  Volume:  Normal  Mood:  Anxious  Affect:  UTA  Thought Process:  Goal Directed and Descriptions of Associations: Intact  Orientation:  Full (Time, Place, and Person)  Thought Content: Logical   Suicidal Thoughts:  No  Homicidal Thoughts:  No  Memory:  Immediate;   Fair Recent;   Fair Remote;  Fair  Judgement:  Fair  Insight:  Fair  Psychomotor Activity:  UTA  Concentration:  Concentration: Fair and Attention Span: Fair  Recall:  FiservFair  Fund of Knowledge: Fair  Language: Fair  Akathisia:  No  Handed:  Right  AIMS (if indicated): Denies tremors, rigidity, stiffness  Assets:  Communication Skills Desire for Improvement Housing Social Support  ADL's:  Intact  Cognition: WNL  Sleep:  Fair   Screenings:   Assessment and Plan: Alfonse RasDolores is a 33 year old Hispanic female, single, employed, lives in [place], has a history of bipolar disorder, insomnia was evaluated by phone today.  Patient is currently progress on the current medication regimen.  Continue plan as noted below.  Plan Bipolar disorder mixed-stable  Abilify 10 mg p.o. daily.   Hydroxyzine 10 mg p.o. 3 times daily as needed for anxiety symptoms.  For insomnia-stable Trazodone 50 mg at bedtime as needed.  I have reviewed medical records in [facility] per Dr. Levonne Spilleravi-dated 12/15/2018-"Decrease Kasandra KnudsenLatuda and taper off.  Start Abilify."  Follow-up in clinic in 1 month or sooner if needed.  I have spent atleast 15 minutes non face to face with patient today. More than 50 % of the time was spent for psychoeducation and supportive psychotherapy and care coordination.'  This note was generated in part or whole with voice recognition software. Voice recognition is usually quite accurate but there are  transcription errors that can and very often do occur. I apologize for any typographical errors that were not detected and corrected.         Jomarie LongsSaramma Donnelle Rubey, MD 01/03/2019, 4:36 PM

## 2019-02-07 ENCOUNTER — Encounter: Payer: Self-pay | Admitting: Psychiatry

## 2019-02-07 ENCOUNTER — Other Ambulatory Visit: Payer: Self-pay

## 2019-02-07 ENCOUNTER — Ambulatory Visit (INDEPENDENT_AMBULATORY_CARE_PROVIDER_SITE_OTHER): Payer: BC Managed Care – PPO | Admitting: Psychiatry

## 2019-02-07 DIAGNOSIS — F3160 Bipolar disorder, current episode mixed, unspecified: Secondary | ICD-10-CM | POA: Diagnosis not present

## 2019-02-07 DIAGNOSIS — F5105 Insomnia due to other mental disorder: Secondary | ICD-10-CM

## 2019-02-07 NOTE — Progress Notes (Signed)
Virtual Visit via Video Note  I connected with Lindsay Brown on 02/07/19 at 10:00 AM EDT by a video enabled telemedicine application and verified that I am speaking with the correct person using two identifiers.   I discussed the limitations of evaluation and management by telemedicine and the availability of in person appointments. The patient expressed understanding and agreed to proceed.     I discussed the assessment and treatment plan with the patient. The patient was provided an opportunity to ask questions and all were answered. The patient agreed with the plan and demonstrated an understanding of the instructions.   The patient was advised to call back or seek an in-person evaluation if the symptoms worsen or if the condition fails to improve as anticipated.   BH MD OP Progress Note  02/07/2019 4:08 PM Lindsay CapraDolores Brown  MRN:  [id number]  Chief Complaint:  Chief Complaint    Follow-up     HPI: Lindsay Brown is a 33 year old hispanic female , single , employed , lives in [place], has a history of Bipolar disorder , insomnia , was evaluated by telemedicine today.  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  She is compliant on Abilify and reports mood as stable. She has gained a couple of pounds , however has been walking more and is trying to watch her diet.She denies any other side effects.  She reports sleep as good on trazodone and takes it only as needed.  Patient reports otherwise she is doing OK.  Denies any other concerns.   Visit Diagnosis:    ICD-10-CM   1. 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 Insomnia due to mental condition F51.05     Past Psychiatric History: Patient is history of bipolar disorder.  I have reviewed past psychiatric history from my progress note on 01/03/2019.  Past Medical History:  Past Medical History:   Diagnosis Date  . Arrhythmia   . Bipolar disorder (HCC)   . Depression    No past surgical history on file.  Family Psychiatric History: I have reviewed family psychiatric history from my progress note on 01/03/2019.  Family History:  Family History  Problem Relation Age of Onset  . Bipolar disorder Mother   . Drug abuse Mother   . Alcohol abuse Mother   . Obesity Mother   . Skin cancer Mother   . Heart disease Mother   . Cancer - Ovarian Mother   . Prostate cancer Father   . Depression Father   . Drug abuse Father   . Alcohol abuse Father   . Thyroid cancer Sister   . Depression Sister   . Anxiety disorder Sister   . Alcohol abuse Brother   . Drug abuse Brother   . Anxiety disorder Brother     Social History: Reviewed social history from my progress note on 01/03/2019. Social History   Socioeconomic History  . Marital status: Single    Spouse name: Not on file  . Number of children: 3  . Years of education: Not on file  . Highest education level: Bachelor's degree (e.g., BA, AB, BS)  Occupational History  . Occupation: [employer]    Comment: full time  Social Needs  . Financial resource strain: Not hard at all  . Food insecurity:    Worry: Never true  Inability: Never true  . Transportation needs:    Medical: No    Non-medical: No  Tobacco Use  . Smoking status: Never Smoker  . Smokeless tobacco: Never Used  Substance and Sexual Activity  . Alcohol use: Yes    Alcohol/week: 2.0 standard drinks    Types: 2 Glasses of wine per week    Comment: not at the moment   . Drug use: No  . Sexual activity: Yes    Birth control/protection: None  Lifestyle  . Physical activity:    Days per week: 0 days    Minutes per session: 0 min  . Stress: To some extent  Relationships  . Social connections:    Talks on phone: Once a week    Gets together: Once a week    Attends religious service: Never    Active member of club or organization: No    Attends  meetings of clubs or organizations: Never    Relationship status: Married  Other Topics Concern  . Not on file  Social History Narrative  . Not on file    Allergies: No Known Allergies  Metabolic Disorder Labs: No results found for: HGBA1C, MPG No results found for: PROLACTIN Lab Results  Component Value Date   CHOL 147 02/06/2016   TRIG 186 (H) 02/06/2016   HDL 36 (L) 02/06/2016   LDLCALC 74 02/06/2016   Lab Results  Component Value Date   TSH 0.780 02/06/2016    Therapeutic Level Labs: No results found for: LITHIUM No results found for: VALPROATE No components found for:  CBMZ  Current Medications: Current Outpatient Medications  Medication Sig Dispense Refill  . ARIPiprazole (ABILIFY) 10 MG tablet Take 1 tablet (10 mg total) by mouth daily. For mood 90 tablet 0  . traZODone (DESYREL) 50 MG tablet Take 1 tablet (50 mg total) by mouth at bedtime. 90 tablet 1   No current facility-administered medications for this visit.      Musculoskeletal: Strength & Muscle Tone: within normal limits Gait & Station: normal Patient leans: N/A  Psychiatric Specialty Exam: Review of Systems  Psychiatric/Behavioral: The patient is not nervous/anxious.   All other systems reviewed and are negative.   There were no vitals taken for this visit.There is no height or weight on file to calculate BMI.  General Appearance: Casual  Eye Contact:  Fair  Speech:  Clear and Coherent  Volume:  Normal  Mood:  Euthymic  Affect:  Congruent  Thought Process:  Goal Directed and Descriptions of Associations: Intact  Orientation:  Full (Time, Place, and Person)  Thought Content: Logical   Suicidal Thoughts:  No  Homicidal Thoughts:  No  Memory:  Immediate;   Fair Recent;   Fair Remote;   Fair  Judgement:  Fair  Insight:  Good  Psychomotor Activity:  Normal  Concentration:  Concentration: Fair and Attention Span: Fair  Recall:  [employer] of Knowledge: Fair  Language: Fair  Akathisia:   No  Handed:  Right  AIMS (if indicated): Denies tremors, rigidity,stiffness  Assets:  Communication Skills Desire for Improvement Housing  ADL's:  Intact  Cognition: WNL  Sleep:  Fair   Screenings:   Assessment and Plan: Lindsay Brown is a 33 year old Hispanic female, single, employed, lives in [place], has a history of bipolar disorder, insomnia was evaluated by telemedicine today.  Patient is currently making progress on the current medication regimen.  Plan as noted below.  Plan Bipolar disorder mixed-stable Abilify 10 mg p.o.  daily Hydroxyzine 10 mg p.o. 3 times daily as needed for anxiety symptoms.  For insomnia- improving Trazodone 50 mg p.o. nightly as needed  Follow-up in clinic in 2 months or sooner if needed.  Appointment scheduled for July 28 at 10:30 AM.  I have spent atleast 15 minutes non face to face with patient today. More than 50 % of the time was spent for psychoeducation and supportive psychotherapy and care coordination.  This note was generated in part or whole with voice recognition software. Voice recognition is usually quite accurate but there are transcription errors that can and very often do occur. I apologize for any typographical errors that were not detected and corrected.        Jomarie Longs, MD 02/07/2019, 4:08 PM

## 2019-03-07 ENCOUNTER — Telehealth: Payer: Self-pay

## 2019-03-07 MED ORDER — TRAZODONE HCL 50 MG PO TABS: 50.0000 mg | ORAL_TABLET | Freq: Every day | ORAL | 0 refills | Status: DC

## 2019-03-07 NOTE — Telephone Encounter (Signed)
done

## 2019-03-07 NOTE — Telephone Encounter (Signed)
Pt needs refills on trazodone has an appt in July    traZODone (DESYREL) 50 MG tablet Medication Date: 08/08/2018 Department: [facility] Ordering/Authorizing: Elvin So, MD  Order Providers  Prescribing Provider Encounter Provider  Elvin So, MD Elvin So, MD  Outpatient Medication Detail   Disp Refills Start End   traZODone (DESYREL) 50 MG tablet 90 tablet 1 08/08/2018    Sig - Route: Take 1 tablet (50 mg total) by mouth at bedtime. - Oral   Class: Print

## 2019-04-11 ENCOUNTER — Encounter: Payer: Self-pay | Admitting: Psychiatry

## 2019-04-11 ENCOUNTER — Ambulatory Visit (INDEPENDENT_AMBULATORY_CARE_PROVIDER_SITE_OTHER): Payer: BC Managed Care – PPO | Admitting: Psychiatry

## 2019-04-11 ENCOUNTER — Other Ambulatory Visit: Payer: Self-pay

## 2019-04-11 DIAGNOSIS — F5105 Insomnia due to other mental disorder: Secondary | ICD-10-CM | POA: Diagnosis not present

## 2019-04-11 DIAGNOSIS — F3160 Bipolar disorder, current episode mixed, unspecified: Secondary | ICD-10-CM | POA: Insufficient documentation

## 2019-04-11 MED ORDER — ARIPIPRAZOLE 10 MG PO TABS: 10.0000 mg | ORAL_TABLET | Freq: Every day | ORAL | 0 refills | Status: DC

## 2019-04-11 NOTE — Progress Notes (Signed)
Virtual Visit via Video Note  I connected with Lindsay Brown on 04/11/19 at 10:30 AM EDT by a video enabled telemedicine application and verified that I am speaking with the correct person using two identifiers.   I discussed the limitations of evaluation and management by telemedicine and the availability of in person appointments. The patient expressed understanding and agreed to proceed.     I discussed the assessment and treatment plan with the patient. The patient was provided an opportunity to ask questions and all were answered. The patient agreed with the plan and demonstrated an understanding of the instructions.   The patient was advised to call back or seek an in-person evaluation if the symptoms worsen or if the condition fails to improve as anticipated.   [place] MD OP Progress Note  04/11/2019 11:26 AM Lindsay Brown  MRN:  [id number]  Chief Complaint:  Chief Complaint    Follow-up     HPI: Lindsay Brown) is a 33 year old Hispanic female, single, employed, lives in [place], has a history of bipolar disorder, insomnia, was evaluated by telemedicine today.  Patient reports she recently had several psychosocial stressors.  Patient reports she was recently exposed to [place] virus and had to go on 14-day quarantine.  She reports it was extremely isolating for her.  She reports she slept a lot during that time.  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  She reports her children stayed with their dad for 21 days and that was also hard for her.  Patient became very tearful when she discussed it.  She reports currently she has her children back.  She has been cleared and she tested negative for the virus.  She is very happy about that.  She reports she is compliant on her medications as prescribed.  She denies any side effects to Abilify.  Denies any significant mood lability, hypomanic or manic episodes.  She reports recently she had some sleep issues however she has  not been using her trazodone much.  She reports she will start using her trazodone a half dosage to see if that will help her.  Prior to the COVID-19 quarantine she did not have any trouble sleeping.  She is hoping that now the children are back at home she will stay busy during the day and that will help her to sleep at night.  Patient denies any suicidality, homicidality or perceptual disturbances.   Visit Diagnosis:    ICD-10-CM   1. Mixed bipolar I disorder (HCC)  F31.60 ARIPiprazole (ABILIFY) 10 MG tablet   Recent ,mild  2. Insomnia due to mental condition  F51.05     Past Psychiatric History: I have reviewed past psychiatric history from my progress note on 01/03/2019.  Past Medical History:  Past Medical History:  Diagnosis Date  . Arrhythmia   . Bipolar disorder ([place])   . Depression    No past surgical history on file.  Family Psychiatric History: I have reviewed family psychiatric history from my progress note on 01/03/2019.  Family History:  Family History  Problem Relation Age of Onset  . Bipolar disorder Mother   . Drug abuse Mother   . Alcohol abuse Mother   . Obesity Mother   . Skin cancer Mother   . Heart disease Mother   . Cancer - Ovarian Mother   . Prostate cancer Father   . Depression Father   . Drug abuse Father   . Alcohol abuse Father   .  Thyroid cancer Sister   . Depression Sister   . Anxiety disorder Sister   . Alcohol abuse Brother   . Drug abuse Brother   . Anxiety disorder Brother     Social History: I have reviewed social history from my progress note on 01/03/2019. Social History   Socioeconomic History  . Marital status: Single    Spouse name: Not on file  . Number of children: 3  . Years of education: Not on file  . Highest education level: Bachelor's degree (e.g., BA, AB, BS)  Occupational History  . Occupation: [employer]    Comment: full time  Social Needs  . Financial resource strain: Not hard at all  . Food insecurity     Worry: Never true    Inability: Never true  . Transportation needs    Medical: No    Non-medical: No  Tobacco Use  . Smoking status: Never Smoker  . Smokeless tobacco: Never Used  Substance and Sexual Activity  . Alcohol use: Yes    Alcohol/week: 2.0 standard drinks    Types: 2 Glasses of wine per week    Comment: not at the moment   . Drug use: No  . Sexual activity: Yes    Birth control/protection: None  Lifestyle  . Physical activity    Days per week: 0 days    Minutes per session: 0 min  . Stress: To some extent  Relationships  . Social Musicianconnections    Talks on phone: Once a week    Gets together: Once a week    Attends religious service: Never    Active member of club or organization: No    Attends meetings of clubs or organizations: Never    Relationship status: Married  Other Topics Concern  . Not on file  Social History Narrative  . Not on file    Allergies: No Known Allergies  Metabolic Disorder Labs: No results found for: HGBA1C, MPG No results found for: PROLACTIN Lab Results  Component Value Date   CHOL 147 02/06/2016   TRIG 186 (H) 02/06/2016   HDL 36 (L) 02/06/2016   LDLCALC 74 02/06/2016   Lab Results  Component Value Date   TSH 0.780 02/06/2016    Therapeutic Level Labs: No results found for: LITHIUM No results found for: VALPROATE No components found for:  CBMZ  Current Medications: Current Outpatient Medications  Medication Sig Dispense Refill  . ARIPiprazole (ABILIFY) 10 MG tablet Take 1 tablet (10 mg total) by mouth daily. For mood 90 tablet 0  . traZODone (DESYREL) 50 MG tablet Take 1 tablet (50 mg total) by mouth at bedtime. 90 tablet 0   No current facility-administered medications for this visit.      Musculoskeletal: Strength & Muscle Tone: within normal limits- per report Gait & Station: normal Patient leans: N/A  Psychiatric Specialty Exam: Review of Systems  Psychiatric/Behavioral: The patient is nervous/anxious.    All other systems reviewed and are negative.   There were no vitals taken for this visit.There is no height or weight on file to calculate BMI.  General Appearance: Casual  Eye Contact:  Fair  Speech:  Clear and Coherent  Volume:  Normal  Mood:  Euthymic  Affect:  Congruent  Thought Process:  Goal Directed and Descriptions of Associations: Intact  Orientation:  Full (Time, Place, and Person)  Thought Content: Logical   Suicidal Thoughts:  No  Homicidal Thoughts:  No  Memory:  Immediate;   Fair Recent;  Fair Remote;   Fair  Judgement:  Fair  Insight:  Fair  Psychomotor Activity:  Normal  Concentration:  Concentration: Fair and Attention Span: Fair  Recall:  FiservFair  Fund of Knowledge: Fair  Language: Fair  Akathisia:  No  Handed:  Right  AIMS (if indicated): denies tremors , rigidity  Assets:  Communication Skills Desire for Improvement Social Support  ADL's:  Intact  Cognition: WNL  Sleep:  restless   Screenings:   Assessment and Plan: Lindsay Brown is a 33 year old Hispanic female, single, employed, lives in [place], has a history of bipolar disorder, insomnia was evaluated by telemedicine today.  Patient is currently doing well on the current medication regimen except for some sleep issues.  We will continue plan as noted below.  Plan For bipolar disorder-stable Abilify 10 mg p.o. daily. Hydroxyzine 10 mg p.o. 3 times daily as needed for anxiety.  For insomnia-restless Discussed to take trazodone 25 to 50 mg p.o. nightly as needed. She has not been taking it.  Follow-up in clinic in 2 to 3 months or sooner if needed.  October 21 at 8:30 AM  I have spent atleast  15 minutes non face to face with patient today. More than 50 % of the time was spent for psychoeducation and supportive psychotherapy and care coordination.  This note was generated in part or whole with voice recognition software. Voice recognition is usually quite accurate but there are transcription errors  that can and very often do occur. I apologize for any typographical errors that were not detected and corrected.      Jomarie LongsSaramma Contrina [place], MD 04/11/2019, 11:26 AM

## 2019-06-03 ENCOUNTER — Other Ambulatory Visit: Payer: Self-pay | Admitting: Child and Adolescent Psychiatry

## 2019-06-03 DIAGNOSIS — F3160 Bipolar disorder, current episode mixed, unspecified: Secondary | ICD-10-CM

## 2019-06-03 DIAGNOSIS — F5105 Insomnia due to other mental disorder: Secondary | ICD-10-CM

## 2019-06-08 NOTE — Telephone Encounter (Signed)
pt called states she needs a refill on her medications to get to next appt.

## 2019-06-09 MED ORDER — ARIPIPRAZOLE 10 MG PO TABS: 10.0000 mg | ORAL_TABLET | Freq: Every day | ORAL | 0 refills | Status: DC

## 2019-06-09 NOTE — Telephone Encounter (Signed)
Sent abilify, trazodone to pharmacy

## 2019-07-05 ENCOUNTER — Other Ambulatory Visit: Payer: Self-pay

## 2019-07-05 ENCOUNTER — Encounter: Payer: Self-pay | Admitting: Psychiatry

## 2019-07-05 ENCOUNTER — Ambulatory Visit (INDEPENDENT_AMBULATORY_CARE_PROVIDER_SITE_OTHER): Payer: BC Managed Care – PPO | Admitting: Psychiatry

## 2019-07-05 ENCOUNTER — Telehealth: Payer: Self-pay | Admitting: Obstetrics and Gynecology

## 2019-07-05 DIAGNOSIS — F5105 Insomnia due to other mental disorder: Secondary | ICD-10-CM | POA: Diagnosis not present

## 2019-07-05 DIAGNOSIS — F3178 Bipolar disorder, in full remission, most recent episode mixed: Secondary | ICD-10-CM

## 2019-07-05 NOTE — Telephone Encounter (Signed)
Patient is schedule for annual and Mirena replacement on 07/26/19 at 1:50 with [facility]

## 2019-07-05 NOTE — Progress Notes (Signed)
Virtual Visit via Video Note  I connected with Lindsay Brown on 07/05/19 at  8:30 AM EDT by a video enabled telemedicine application and verified that I am speaking with the correct person using two identifiers.   I discussed the limitations of evaluation and management by telemedicine and the availability of in person appointments. The patient expressed understanding and agreed to proceed.   I discussed the assessment and treatment plan with the patient. The patient was provided an opportunity to ask questions and all were answered. The patient agreed with the plan and demonstrated an understanding of the instructions.   The patient was advised to call back or seek an in-person evaluation if the symptoms worsen or if the condition fails to improve as anticipated.  BH MD OP Progress Note  07/05/2019 12:16 PM Lindsay Brown  MRN:  [id number]  Chief Complaint:  Chief Complaint    Follow-up     HPI: Lindsay Brown) is a 33 year old Hispanic female, single, employed, lives in [place], has a history of bipolar disorder , insomnia was evaluated by telemedicine today.  Patient today reports she stopped taking the Abilify few weeks ago.  She reports she was feeling better with regards to her mood and wanted to give it a trial.  She reports she has been monitoring her symptoms very closely.  She denies any mood lability.  She denies any burst of energy.  She reports she is able to take care of herself and her children.  She reports sleep is good.  She continues to take trazodone for sleep which helps.  She reports she does have psychosocial stressors of relationship struggles with her boyfriend as well as the current pandemic.  She reports she is waiting to hear back from her supervisors about a plan to return to work in person.  She reports she continues to do remote work at this time.  Patient denies any suicidality, homicidality or perceptual disturbances.  She denies any other concerns  today. Visit Diagnosis:    ICD-10-CM   1. Bipolar disorder, in full remission, most recent episode mixed (HCC)  F31.78   2. Insomnia due to mental condition  F51.05     Past Psychiatric History: I have reviewed past psychiatric history from my progress note on 01/03/2019.  Past Medical History:  Past Medical History:  Diagnosis Date  . Arrhythmia   . Bipolar disorder (HCC)   . Depression    History reviewed. No pertinent surgical history.  Family Psychiatric History: I have reviewed family psychiatric history from my progress note on 01/03/2019.  Family History:  Family History  Problem Relation Age of Onset  . Bipolar disorder Mother   . Drug abuse Mother   . Alcohol abuse Mother   . Obesity Mother   . Skin cancer Mother   . Heart disease Mother   . Cancer - Ovarian Mother   . Prostate cancer Father   . Depression Father   . Drug abuse Father   . Alcohol abuse Father   . Thyroid cancer Sister   . Depression Sister   . Anxiety disorder Sister   . Alcohol abuse Brother   . Drug abuse Brother   . Anxiety disorder Brother     Social History: I have reviewed social history from my progress note on 01/03/2019. Social History   Socioeconomic History  . Marital status: Single    Spouse name: Not on file  . Number of children: 3  . Years of education: Not on  file  . Highest education level: Bachelor's degree (e.g., BA, AB, BS)  Occupational History  . Occupation: [employer]    Comment: full time  Social Needs  . Financial resource strain: Not hard at all  . Food insecurity    Worry: Never true    Inability: Never true  . Transportation needs    Medical: No    Non-medical: No  Tobacco Use  . Smoking status: Never Smoker  . Smokeless tobacco: Never Used  Substance and Sexual Activity  . Alcohol use: Yes    Alcohol/week: 2.0 standard drinks    Types: 2 Glasses of wine per week    Comment: not at the moment   . Drug use: No  . Sexual activity: Yes     Birth control/protection: None  Lifestyle  . Physical activity    Days per week: 0 days    Minutes per session: 0 min  . Stress: To some extent  Relationships  . Social Musicianconnections    Talks on phone: Once a week    Gets together: Once a week    Attends religious service: Never    Active member of club or organization: No    Attends meetings of clubs or organizations: Never    Relationship status: Married  Other Topics Concern  . Not on file  Social History Narrative  . Not on file    Allergies: No Known Allergies  Metabolic Disorder Labs: No results found for: HGBA1C, MPG No results found for: PROLACTIN Lab Results  Component Value Date   CHOL 147 02/06/2016   TRIG 186 (H) 02/06/2016   HDL 36 (L) 02/06/2016   LDLCALC 74 02/06/2016   Lab Results  Component Value Date   TSH 0.780 02/06/2016    Therapeutic Level Labs: No results found for: LITHIUM No results found for: VALPROATE No components found for:  CBMZ  Current Medications: Current Outpatient Medications  Medication Sig Dispense Refill  . ARIPiprazole (ABILIFY) 10 MG tablet Take 1 tablet (10 mg total) by mouth daily. For mood 90 tablet 0  . traZODone (DESYREL) 50 MG tablet TAKE 1 TABLET BY MOUTH EVERYDAY AT BEDTIME 90 tablet 0   No current facility-administered medications for this visit.      Musculoskeletal: Strength & Muscle Tone: UTA Gait & Station: normal Patient leans: N/A  Psychiatric Specialty Exam: Review of Systems  Psychiatric/Behavioral: Negative for depression, hallucinations, substance abuse and suicidal ideas. The patient is not nervous/anxious and does not have insomnia.   All other systems reviewed and are negative.   There were no vitals taken for this visit.There is no height or weight on file to calculate BMI.  General Appearance: Casual  Eye Contact:  Fair  Speech:  Normal Rate  Volume:  Normal  Mood:  Euthymic  Affect:  Congruent  Thought Process:  Goal Directed and  Descriptions of Associations: Intact  Orientation:  Full (Time, Place, and Person)  Thought Content: Logical   Suicidal Thoughts:  No  Homicidal Thoughts:  No  Memory:  Immediate;   Fair Recent;   Fair Remote;   Fair  Judgement:  Fair  Insight:  Fair  Psychomotor Activity:  Normal  Concentration:  Concentration: Fair and Attention Span: Fair  Recall:  [employer]Fair  Fund of Knowledge: Fair  Language: Fair  Akathisia:  No  Handed:  Right  AIMS (if indicated): denies tremors, rigidity  Assets:  Communication Skills Desire for Improvement Housing Intimacy Social Support Talents/Skills  ADL's:  Intact  Cognition: WNL  Sleep:  Fair   Screenings:   Assessment and Plan: Khyleigh is a 33 year old Hispanic female, single, employed, lives in [place], has a history of bipolar disorder, insomnia was evaluated by telemedicine today.  Patient is currently noncompliant with medication and reports she wants to give hersel a drug holiday.  She reports she has reached out to her family and asked for support to monitor her for symptoms.  She agrees to get back on medications if she notices any worsening symptoms.  Plan For bipolar disorder in remission She is currently noncompliant on medications. Discontinue Abilify for noncompliance. Hydroxyzine 10 mg p.o. 3 times daily as needed for anxiety  Insomnia-stable Trazodone 25 to 50 mg p.o. nightly as needed.  She uses it as needed.  Some time was spent providing her medication education, education about bipolar symptoms, monitor herself for recurrence of manic, hypomanic or depressive symptoms.  Also advised to make use of her support system.  Discussed with her to reach out to writer if she has worsening symptoms.  Follow-up in clinic in 2 months or sooner if needed.  December 23 at 8:30 AM  I have spent atleast 15 minutes non face to face with patient today. More than 50 % of the time was spent for psychoeducation and supportive psychotherapy and care  coordination. This note was generated in part or whole with voice recognition software. Voice recognition is usually quite accurate but there are transcription errors that can and very often do occur. I apologize for any typographical errors that were not detected and corrected.       Ursula Alert, MD 07/05/2019, 12:16 PM

## 2019-07-07 NOTE — Telephone Encounter (Signed)
Noted. Will order to arrive by apt date/time. 

## 2019-07-25 NOTE — Progress Notes (Signed)
PCP:  [facility]   Chief Complaint  Patient presents with  . Gynecologic Exam  . Contraception    Mirena removal/reinsertion     HPI:      [name] is a 33 y.o. No obstetric history on file. who LMP was No LMP recorded. (Menstrual status: IUD)., presents today for her NP> 3 yrs annual examination.  Her menses are absent with IUD. Dysmenorrhea none. She does not have intermenstrual bleeding.  Sex activity: single partner, contraception - Mirena IUD placed 07/06/14, due for replacement next yr with new guidelines, but pt ready to do it today.  Last Pap: July 06, 2014  Results were: no abnormalities /neg HPV DNA  Hx of STDs: none  There is no FH of breast cancer. There is no FH of ovarian cancer. The patient does not d o self-breast exams.  Tobacco use: social with alcohol use Alcohol use: social drinker No drug use.  Exercise: moderately active  She does get adequate calcium and Vitamin D in her diet.   Patient Active Problem List   Diagnosis Date Noted  . 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 Insomnia due to mental condition 04/11/2019  . Allergic state 05/28/2015  . Anxiety 05/28/2015  . Back pain, chronic 05/28/2015  . Clinical depression 05/28/2015  . Impingement syndrome of shoulder 12/08/2012  . 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 Rotator cuff tendonitis 12/08/2012  . Pain in shoulder 12/01/2012  . Curvature of spine 12/01/2012    Past Surgical History:  Procedure Laterality Date  . TONSILLECTOMY  2011    Family History  Problem Relation Age of Onset  . Bipolar disorder Mother   . Drug abuse Mother   . Alcohol abuse Mother   . Obesity Mother   . Skin cancer Mother   . Heart disease Mother   . Ovarian cysts Mother   . Prostate cancer Father   . Depression Father   . Drug abuse Father   . Alcohol abuse Father   . Thyroid cancer Sister   . Depression Sister   . Anxiety disorder Sister   . Alcohol abuse Brother    . Drug abuse Brother   . Anxiety disorder Brother   . Breast cancer Neg Hx   . Ovarian cancer Neg Hx     Social History   Socioeconomic History  . Marital status: Single    Spouse name: Not on file  . Number of children: 3  . Years of education: Not on file  . Highest education level: Bachelor's degree (e.g., BA, AB, BS)  Occupational History  . Occupation: [employer]    Comment: full time  Social Needs  . Financial resource strain: Not hard at all  . Food insecurity    Worry: Never true    Inability: Never true  . Transportation needs    Medical: No    Non-medical: No  Tobacco Use  . Smoking status: Never Smoker  . Smokeless tobacco: Never Used  Substance and Sexual Activity  . Alcohol use: Yes    Alcohol/week: 2.0 standard drinks    Types: 2 Glasses of wine per week    Comment: not at the moment   . Drug use: No  . Sexual activity: Yes    Birth control/protection: I.U.D.    Comment: Mirena  Lifestyle  . Physical activity    Days per week: 0 days    Minutes per session: 0 min  . Stress: To some extent  Relationships  . Social Musicianconnections    Talks on phone: Once a week    Gets together: Once a week    Attends religious service: Never    Active member of club or organization: No    Attends meetings of clubs or organizations: Never    Relationship status: Married  . Intimate partner violence    Fear of current or ex partner: No    Emotionally abused: No    Physically abused: No    Forced sexual activity: No  Other Topics Concern  . Not on file  Social History Narrative  . Not on file     Current Outpatient Medications:  .  levonorgestrel (MIRENA) 20 MCG/24HR IUD, by Intrauterine route once for 1 dose., Disp: 1 each, Rfl: 0 .  traZODone (DESYREL) 50 MG tablet, TAKE 1 TABLET BY MOUTH EVERYDAY AT BEDTIME, Disp: 90 tablet, Rfl: 0 .  ARIPiprazole (ABILIFY) 10 MG tablet, Take 1 tablet (10 mg total) by mouth daily. For mood (Patient not taking:  Reported on 07/26/2019), Disp: 90 tablet, Rfl: 0    ROS:  Review of Systems  Constitutional: Negative for fatigue, fever and unexpected weight change.  Respiratory: Negative for cough, shortness of breath and wheezing.   Cardiovascular: Negative for chest pain, palpitations and leg swelling.  Gastrointestinal: Negative for blood in stool, constipation, diarrhea, nausea and vomiting.  Endocrine: Negative for cold intolerance, heat intolerance and polyuria.  Genitourinary: Negative for dyspareunia, dysuria, flank pain, frequency, genital sores, hematuria, menstrual problem, pelvic pain, urgency, vaginal bleeding, vaginal discharge and vaginal pain.  Musculoskeletal: Negative for back pain, joint swelling and myalgias.  Skin: Negative for rash.  Neurological: Negative for dizziness, syncope, light-headedness, numbness and headaches.  Hematological: Negative for adenopathy.  Psychiatric/Behavioral: Positive for agitation and dysphoric mood. Negative for confusion, sleep disturbance and suicidal ideas. The patient is not nervous/anxious.   BREAST: No symptoms   Objective: BP (!) 130/100   Ht 5\' 6"  (1.676 m)   Wt 186 lb (84.4 kg)   BMI 30.02 kg/m    Physical Exam Constitutional:      Appearance: She is well-developed.  Genitourinary:     Vulva, vagina, cervix, uterus, right adnexa and left adnexa normal.     No vulval lesion or tenderness noted.     No vaginal discharge, erythema or tenderness.     No cervical polyp.     Uterus is not enlarged or tender.     No right or left adnexal mass present.     Right adnexa not tender.     Left adnexa not tender.  Neck:     Musculoskeletal: Normal range of motion.     Thyroid: No thyromegaly.  Cardiovascular:     Rate and Rhythm: Normal rate and regular rhythm.     Heart sounds: Normal heart sounds. No murmur.  Pulmonary:     Effort: Pulmonary effort is normal.     Breath sounds: Normal breath sounds.  Chest:     Breasts:         Right: No mass, nipple discharge, skin change or tenderness.        Left: No mass, nipple discharge, skin change or tenderness.  Abdominal:     Palpations: Abdomen is soft.     Tenderness: There is no abdominal tenderness. There is no guarding.  Musculoskeletal: Normal range of motion.  Neurological:     General: No focal deficit present.     Mental Status: She is alert and oriented  to person, place, and time.     Cranial Nerves: No cranial nerve deficit.  Skin:    General: Skin is warm and dry.  Psychiatric:        Mood and Affect: Mood normal.        Behavior: Behavior normal.        Thought Content: Thought content normal.        Judgment: Judgment normal.  Vitals signs reviewed.    IUD Removal Strings of IUD identified and grasped.  IUD removed without problem with [place] forceps.  Pt tolerated this well.  IUD noted to be intact.  IUD Insertion Procedure Note Patient identified, informed consent performed, consent signed.   Discussed risks of irregular bleeding, cramping, infection, malpositioning or misplacement of the IUD outside the uterus which may require further procedure such as laparoscopy, risk of failure <1%. Time out was performed.    Speculum placed in the vagina.  Cervix visualized.  Cleaned with Betadine x 2.  Grasped anteriorly with a single tooth tenaculum.  Uterus sounded to 8.5 cm.   IUD placed per manufacturer's recommendations.  Strings trimmed to 3 cm. Tenaculum was removed, good hemostasis noted.  Patient tolerated procedure well.   Assessment/Plan: Encounter for annual routine gynecological examination  Cervical cancer screening - Plan: [facility] P[facility]  Screening for HPV (human papillomavirus) - Plan: [facility]  Encounter for removal and reinsertion of intrauterine contraceptive device (IUD) - Plan: levonorgestrel (MIRENA) 20 MCG/24HR IUD  Meds ordered this encounter  Medications  . levonorgestrel (MIRENA) 20 MCG/24HR IUD    Sig: by Intrauterine route once for  1 dose.    Dispense:  1 each    Refill:  0    Order Specific Question:   Supervising Provider    Answer:   Nadara Mustard [[id number]]             GYN counsel adequate intake of calcium and vitamin D, diet and exercise   Patient was given post-procedure instructions.  She was advised to have backup contraception for one week.   Call if you are having increasing pain, cramps or bleeding or if you have a fever greater than 100.4 degrees F., shaking chills, nausea or vomiting. Patient was also asked to check IUD strings periodically and follow up in 4 weeks for IUD check.     F/U    Return in about 4 weeks (around 08/23/2019) for IUD f/u.  Alicia B. Copland, PA-C 07/26/2019

## 2019-07-26 ENCOUNTER — Encounter: Payer: Self-pay | Admitting: Obstetrics and Gynecology

## 2019-07-26 ENCOUNTER — Other Ambulatory Visit (HOSPITAL_COMMUNITY)
Admission: RE | Admit: 2019-07-26 | Discharge: 2019-07-26 | Disposition: A | Discharge status: Home / Self Care | Payer: BC Managed Care – PPO | Source: Ambulatory Visit | Attending: Obstetrics and Gynecology | Admitting: Obstetrics and Gynecology

## 2019-07-26 ENCOUNTER — Other Ambulatory Visit: Payer: Self-pay

## 2019-07-26 ENCOUNTER — Ambulatory Visit (INDEPENDENT_AMBULATORY_CARE_PROVIDER_SITE_OTHER): Payer: BC Managed Care – PPO | Admitting: Obstetrics and Gynecology

## 2019-07-26 VITALS — BP 130/100 | Ht 66.0 in | Wt 186.0 lb

## 2019-07-26 DIAGNOSIS — Z30433 Encounter for removal and reinsertion of intrauterine contraceptive device: Secondary | ICD-10-CM

## 2019-07-26 DIAGNOSIS — Z124 Encounter for screening for malignant neoplasm of cervix: Secondary | ICD-10-CM | POA: Diagnosis present

## 2019-07-26 DIAGNOSIS — Z1151 Encounter for screening for human papillomavirus (HPV): Secondary | ICD-10-CM | POA: Diagnosis present

## 2019-07-26 DIAGNOSIS — Z23 Encounter for immunization: Secondary | ICD-10-CM | POA: Diagnosis not present

## 2019-07-26 DIAGNOSIS — Z01419 Encounter for gynecological examination (general) (routine) without abnormal findings: Secondary | ICD-10-CM

## 2019-07-26 MED ORDER — LEVONORGESTREL 20 MCG/24HR IU IUD: INTRAUTERINE_SYSTEM | Freq: Once | INTRAUTERINE | 0 refills | Status: AC

## 2019-07-26 NOTE — Addendum Note (Signed)
Addended by: Drenda Freeze on: 07/26/2019 02:49 PM   Modules accepted: Orders

## 2019-07-26 NOTE — Patient Instructions (Signed)
I value your feedback and entrusting us with your care. If you get a [facility] patient survey, I would appreciate you taking the time to let us know about your experience today. Thank you! 

## 2019-07-31 LAB — CYTOLOGY - PAP: Diagnosis: NEGATIVE

## 2019-09-01 ENCOUNTER — Other Ambulatory Visit: Payer: Self-pay | Admitting: Psychiatry

## 2019-09-06 ENCOUNTER — Ambulatory Visit: Payer: BC Managed Care – PPO | Admitting: Psychiatry

## 2019-09-11 ENCOUNTER — Ambulatory Visit (INDEPENDENT_AMBULATORY_CARE_PROVIDER_SITE_OTHER): Payer: BC Managed Care – PPO | Admitting: Psychiatry

## 2019-09-11 ENCOUNTER — Other Ambulatory Visit: Payer: Self-pay

## 2019-09-11 ENCOUNTER — Encounter: Payer: Self-pay | Admitting: Psychiatry

## 2019-09-11 DIAGNOSIS — F5105 Insomnia due to other mental disorder: Secondary | ICD-10-CM | POA: Diagnosis not present

## 2019-09-11 DIAGNOSIS — F3178 Bipolar disorder, in full remission, most recent episode mixed: Secondary | ICD-10-CM

## 2019-09-11 MED ORDER — ARIPIPRAZOLE 10 MG PO TABS: 10.0000 mg | ORAL_TABLET | Freq: Every day | ORAL | 0 refills | Status: DC

## 2019-09-11 NOTE — Progress Notes (Signed)
Virtual Visit via Video Note  I connected with Lindsay Brown on 09/11/19 at  8:30 AM EST by a video enabled telemedicine application and verified that I am speaking with the correct person using two identifiers.   I discussed the limitations of evaluation and management by telemedicine and the availability of in person appointments. The patient expressed understanding and agreed to proceed.     I discussed the assessment and treatment plan with the patient. The patient was provided an opportunity to ask questions and all were answered. The patient agreed with the plan and demonstrated an understanding of the instructions.   The patient was advised to call back or seek an in-person evaluation if the symptoms worsen or if the condition fails to improve as anticipated.   MD OP Progress Note  09/11/2019 9:37 AM Lindsay Brown  MRN:  [id number]  Chief Complaint:  Chief Complaint    Follow-up     HPI: Lindsay Brown( Lindsay Brown) is a 33 year old Hispanic female, single, employed, lives in [place], has a history of bipolar disorder, insomnia was evaluated by telemedicine today.  Patient today reports she restarted taking the Abilify sometime in November.  She reports she had racing thoughts and needed the hydroxyzine every day several times and hence she decided to go back on the Abilify which she had stopped previously.  She currently tolerates the medication well.  She currently denies any mood symptoms.  She denies any racing thoughts.  She reports sleep is good on the trazodone.  Patient denies any perceptual disturbances.  Patient denies any suicidal ideation or homicidal thoughts at this time.  Patient reports her holidays are going on well.  She reports she goes back to work in January.  She has a new job, which started sometime in February.  She looks forward to that.  It is with [employer].  Patient denies any other concerns today. Visit Diagnosis:    ICD-10-CM   1.  Bipolar disorder, in full remission, most recent episode mixed (HCC)  F31.78 ARIPiprazole (ABILIFY) 10 MG tablet   Recent ,mild  2. Insomnia due to mental condition  F51.05     Past Psychiatric History: Reviewed past psychiatric history from my progress note on 01/03/2019.  Past Medical History:  Past Medical History:  Diagnosis Date  . Arrhythmia   . Asthma   . Bipolar disorder (HCC)   . Depression   . GERD (gastroesophageal reflux disease)     Past Surgical History:  Procedure Laterality Date  . TONSILLECTOMY  2011    Family Psychiatric History: Reviewed family psychiatric history from my progress note on 01/03/2019.  Family History:  Family History  Problem Relation Age of Onset  . Bipolar disorder Mother   . Drug abuse Mother   . Alcohol abuse Mother   . Obesity Mother   . Skin cancer Mother   . Heart disease Mother   . Ovarian cysts Mother   . Prostate cancer Father   . Depression Father   . Drug abuse Father   . Alcohol abuse Father   . Thyroid cancer Sister   . Depression Sister   . Anxiety disorder Sister   . Alcohol abuse Brother   . Drug abuse Brother   . Anxiety disorder Brother   . Breast cancer Neg Hx   . Ovarian cancer Neg Hx     Social History: Reviewed social history from my progress note on 01/03/2019. Social History   Socioeconomic History  . Marital status:  Single    Spouse name: Not on file  . Number of children: 3  . Years of education: Not on file  . Highest education level: Bachelor's degree (e.g., BA, AB, BS)  Occupational History  . Occupation: [employer]    Comment: full time  Tobacco Use  . Smoking status: Never Smoker  . Smokeless tobacco: Never Used  Substance and Sexual Activity  . Alcohol use: Yes    Alcohol/week: 2.0 standard drinks    Types: 2 Glasses of wine per week    Comment: not at the moment   . Drug use: No  . Sexual activity: Yes    Birth control/protection: I.U.D.    Comment: Mirena  Other Topics  Concern  . Not on file  Social History Narrative  . Not on file   Social Determinants of Health   Financial Resource Strain:   . Difficulty of Paying Living Expenses: Not on file  Food Insecurity:   . Worried About Programme researcher, broadcasting/film/video in the Last Year: Not on file  . Ran Out of Food in the Last Year: Not on file  Transportation Needs:   . Lack of Transportation (Medical): Not on file  . Lack of Transportation (Non-Medical): Not on file  Physical Activity:   . Days of Exercise per Week: Not on file  . Minutes of Exercise per Session: Not on file  Stress:   . Feeling of Stress : Not on file  Social Connections:   . Frequency of Communication with Friends and Family: Not on file  . Frequency of Social Gatherings with Friends and Family: Not on file  . Attends Religious Services: Not on file  . Active Member of Clubs or Organizations: Not on file  . Attends Banker Meetings: Not on file  . Marital Status: Not on file    Allergies: No Known Allergies  Metabolic Disorder Labs: No results found for: HGBA1C, MPG No results found for: PROLACTIN Lab Results  Component Value Date   CHOL 147 02/06/2016   TRIG 186 (H) 02/06/2016   HDL 36 (L) 02/06/2016   LDLCALC 74 02/06/2016   Lab Results  Component Value Date   TSH 0.780 02/06/2016    Therapeutic Level Labs: No results found for: LITHIUM No results found for: VALPROATE No components found for:  CBMZ  Current Medications: Current Outpatient Medications  Medication Sig Dispense Refill  . ARIPiprazole (ABILIFY) 10 MG tablet Take 1 tablet (10 mg total) by mouth daily. For mood 90 tablet 0  . levonorgestrel (MIRENA) 20 MCG/24HR IUD by Intrauterine route once for 1 dose. 1 each 0  . traZODone (DESYREL) 50 MG tablet TAKE 1 TABLET BY MOUTH EVERYDAY AT BEDTIME 90 tablet 0   No current facility-administered medications for this visit.     Musculoskeletal: Strength & Muscle Tone: UTA Gait & Station:  normal Patient leans: N/A  Psychiatric Specialty Exam: Review of Systems  Psychiatric/Behavioral: Negative for agitation, behavioral problems, confusion, decreased concentration, dysphoric mood, hallucinations, self-injury, sleep disturbance and suicidal ideas. The patient is not nervous/anxious and is not hyperactive.   All other systems reviewed and are negative.   There were no vitals taken for this visit.There is no height or weight on file to calculate BMI.  General Appearance: Casual  Eye Contact:  Fair  Speech:  Normal Rate  Volume:  Normal  Mood:  Euthymic  Affect:  Congruent  Thought Process:  Goal Directed and Descriptions of Associations: Intact  Orientation:  Full (  Time, Place, and Person)  Thought Content: Logical   Suicidal Thoughts:  No  Homicidal Thoughts:  No  Memory:  Immediate;   Fair Recent;   Fair Remote;   Fair  Judgement:  Fair  Insight:  Fair  Psychomotor Activity:  Normal  Concentration:  Concentration: Fair and Attention Span: Fair  Recall:  [employer] of Knowledge: Fair  Language: Fair  Akathisia:  No  Handed:  Right  AIMS (if indicated): Denies tremors, rigidity  Assets:  Communication Skills Desire for Improvement Social Support  ADL's:  Intact  Cognition: WNL  Sleep:  Fair   Screenings:   Assessment and Plan: Maddelynn is a 33 year old Hispanic female, single, employed, lives in [place], has a history of bipolar disorder, insomnia was evaluated by telemedicine today.  She is currently doing well on the current medication regimen.  Plan as noted below.  Plan Bipolar disorder in remission Abilify 10 mg p.o. daily Hydroxyzine 10 mg p.o. 3 times daily as needed for anxiety.  Insomnia-stable Trazodone 25 to 50 mg p.o. nightly as needed.   Follow-up in clinic in 2 to 3 months or sooner if needed.  March 26 at 8:30 AM.  I have spent atleast 15 minutes non face to face with patient today. More than 50 % of the time was spent for  psychoeducation and supportive psychotherapy and care coordination. This note was generated in part or whole with voice recognition software. Voice recognition is usually quite accurate but there are transcription errors that can and very often do occur. I apologize for any typographical errors that were not detected and corrected.      Ursula Alert, MD 09/11/2019, 9:37 AM

## 2019-12-08 ENCOUNTER — Ambulatory Visit (INDEPENDENT_AMBULATORY_CARE_PROVIDER_SITE_OTHER): Payer: BC Managed Care – PPO | Admitting: Psychiatry

## 2019-12-08 ENCOUNTER — Encounter: Payer: Self-pay | Admitting: Psychiatry

## 2019-12-08 ENCOUNTER — Other Ambulatory Visit: Payer: Self-pay

## 2019-12-08 DIAGNOSIS — F5105 Insomnia due to other mental disorder: Secondary | ICD-10-CM | POA: Diagnosis not present

## 2019-12-08 DIAGNOSIS — F3178 Bipolar disorder, in full remission, most recent episode mixed: Secondary | ICD-10-CM | POA: Diagnosis not present

## 2019-12-08 MED ORDER — ARIPIPRAZOLE 10 MG PO TABS: 10.0000 mg | ORAL_TABLET | Freq: Every day | ORAL | 0 refills | Status: DC

## 2019-12-08 MED ORDER — TRAZODONE HCL 50 MG PO TABS: ORAL_TABLET | ORAL | 0 refills | Status: DC

## 2019-12-08 NOTE — Progress Notes (Signed)
Provider Location : [facility] Patient Location : Work  Virtual Visit via Telephone Note  I connected with Lindsay Brown on 12/08/19 at  8:30 AM EDT by telephone and verified that I am speaking with the correct person using two identifiers.   I discussed the limitations, risks, security and privacy concerns of performing an evaluation and management service by telephone and the availability of in person appointments. I also discussed with the patient that there may be a patient responsible charge related to this service. The patient expressed understanding and agreed to proceed.    I discussed the assessment and treatment plan with the patient. The patient was provided an opportunity to ask questions and all were answered. The patient agreed with the plan and demonstrated an understanding of the instructions.   The patient was advised to call back or seek an in-person evaluation if the symptoms worsen or if the condition fails to improve as anticipated.   [facility] MD OP Progress Note  12/08/2019 8:51 AM Lindsay Brown  MRN:  [id number]  Chief Complaint:  Chief Complaint    Follow-up     HPI: Lindsay Brown is a 34 year old Hispanic female, single, employed, lives in [place], has a history of bipolar disorder, insomnia was evaluated by phone today.  Due to connection problem with video the session was conducted using telephone.  Patient today reports she is currently doing well.  She denies any significant mood lability, racing thoughts or anxiety symptoms.  Patient reports sleep as good.  She continues to take trazodone.  She continues to be compliant on Abilify 10 mg.  She currently denies side effects.  She reports she is currently at her new job with [employer].  She reports she started a new job in February.  So far it is going well.  She is currently staying away from alcohol.  She denies any binging.  She reports she got her first COVID-19 vaccine shot and is due for her second  1 today.  She looks forward to that.  Patient reports her 79-year-old twins are currently in quarantine because they got exposed to someone with COVID-19.  She reports so far they are doing okay and has not tested positive.  She denies any suicidality, homicidality or perceptual disturbances.  She denies any other concerns today. Visit Diagnosis:    ICD-10-CM   1. Bipolar disorder, in full remission, most recent episode mixed (HCC)  F31.78 ARIPiprazole (ABILIFY) 10 MG tablet    traZODone (DESYREL) 50 MG tablet   Recent ,mild  2. Insomnia due to mental condition  F51.05     Past Psychiatric History: I have reviewed past psychiatric history from my progress note on 01/03/2019  Past Medical History:  Past Medical History:  Diagnosis Date  . Arrhythmia   . Asthma   . Bipolar disorder ([place])   . Depression   . GERD (gastroesophageal reflux disease)     Past Surgical History:  Procedure Laterality Date  . TONSILLECTOMY  2011    Family Psychiatric History: I have reviewed family psychiatric history from my progress note on 01/03/2019  Family History:  Family History  Problem Relation Age of Onset  . Bipolar disorder Mother   . Drug abuse Mother   . Alcohol abuse Mother   . Obesity Mother   . Skin cancer Mother   . Heart disease Mother   . Ovarian cysts Mother   . Prostate cancer Father   . Depression Father   . Drug abuse Father   .  Alcohol abuse Father   . Thyroid cancer Sister   . Depression Sister   . Anxiety disorder Sister   . Alcohol abuse Brother   . Drug abuse Brother   . Anxiety disorder Brother   . Breast cancer Neg Hx   . Ovarian cancer Neg Hx     Social History: I have reviewed social history from my progress note on 01/03/2019 Social History   Socioeconomic History  . Marital status: Single    Spouse name: Not on file  . Number of children: 3  . Years of education: Not on file  . Highest education level: Bachelor's degree (e.g., BA, AB, BS)   Occupational History  . Occupation: [employer]    Comment: full time  Tobacco Use  . Smoking status: Never Smoker  . Smokeless tobacco: Never Used  Substance and Sexual Activity  . Alcohol use: Yes    Alcohol/week: 2.0 standard drinks    Types: 2 Glasses of wine per week    Comment: not at the moment   . Drug use: No  . Sexual activity: Yes    Birth control/protection: I.U.D.    Comment: Mirena  Other Topics Concern  . Not on file  Social History Narrative  . Not on file   Social Determinants of Health   Financial Resource Strain:   . Difficulty of Paying Living Expenses:   Food Insecurity:   . Worried About Programme researcher, broadcasting/film/video in the Last Year:   . Barista in the Last Year:   Transportation Needs:   . Freight forwarder (Medical):   Marland Kitchen Lack of Transportation (Non-Medical):   Physical Activity:   . Days of Exercise per Week:   . Minutes of Exercise per Session:   Stress:   . Feeling of Stress :   Social Connections:   . Frequency of Communication with Friends and Family:   . Frequency of Social Gatherings with Friends and Family:   . Attends Religious Services:   . Active Member of Clubs or Organizations:   . Attends Banker Meetings:   Marland Kitchen Marital Status:     Allergies: No Known Allergies  Metabolic Disorder Labs: No results found for: HGBA1C, MPG No results found for: PROLACTIN Lab Results  Component Value Date   CHOL 147 02/06/2016   TRIG 186 (H) 02/06/2016   HDL 36 (L) 02/06/2016   LDLCALC 74 02/06/2016   Lab Results  Component Value Date   TSH 0.780 02/06/2016    Therapeutic Level Labs: No results found for: LITHIUM No results found for: VALPROATE No components found for:  CBMZ  Current Medications: Current Outpatient Medications  Medication Sig Dispense Refill  . ARIPiprazole (ABILIFY) 10 MG tablet Take 1 tablet (10 mg total) by mouth daily. For mood 90 tablet 0  . levonorgestrel (MIRENA) 20 MCG/24HR IUD by  Intrauterine route once for 1 dose. 1 each 0  . metroNIDAZOLE (FLAGYL) 500 MG tablet Take 500 mg by mouth 2 (two) times daily.    . traZODone (DESYREL) 50 MG tablet TAKE 1 TABLET BY MOUTH EVERYDAY AT BEDTIME 90 tablet 0   No current facility-administered medications for this visit.     Musculoskeletal: Strength & Muscle Tone: UTA Gait & Station: UTA Patient leans: N/A  Psychiatric Specialty Exam: Review of Systems  Psychiatric/Behavioral: Negative for agitation, behavioral problems, confusion, decreased concentration, dysphoric mood, hallucinations, self-injury, sleep disturbance and suicidal ideas. The patient is not nervous/anxious and is not hyperactive.  All other systems reviewed and are negative.   There were no vitals taken for this visit.There is no height or weight on file to calculate BMI.  General Appearance: UTA  Eye Contact:  UTA  Speech:  Clear and Coherent  Volume:  Normal  Mood:  Euthymic  Affect:  UTA  Thought Process:  Goal Directed and Descriptions of Associations: Intact  Orientation:  Full (Time, Place, and Person)  Thought Content: Logical   Suicidal Thoughts:  No  Homicidal Thoughts:  No  Memory:  Immediate;   Fair Recent;   Fair Remote;   Fair  Judgement:  Fair  Insight:  Fair  Psychomotor Activity:  UTA  Concentration:  Concentration: Fair and Attention Span: Fair  Recall:  [employer] of Knowledge: Fair  Language: Fair  Akathisia:  No  Handed:  Right  AIMS (if indicated): UTA  Assets:  Communication Skills Desire for Improvement Housing Social Support  ADL's:  Intact  Cognition: WNL  Sleep:  Fair   Screenings:   Assessment and Plan: Marchelle Folks is a 34 year old Hispanic female, single, employed, lives in [place], has a history of bipolar disorder, insomnia was evaluated by telemedicine today.  Patient is currently stable on current medication regimen.  Plan as noted below.  Plan Bipolar disorder in remission Abilify 10 mg p.o.  daily Hydroxyzine 10 mg p.o. 3 times daily as needed for anxiety.  Insomnia-stable Trazodone 25 to 50 mg p.o. nightly as needed  Follow-up in clinic in 3 months or sooner if needed.  I have spent atleast 19 minutes non face to face with patient today. More than 50 % of the time was spent for ordering medications and test ,psychoeducation and supportive psychotherapy and care coordination,as well as documenting clinical information in electronic health record. This note was generated in part or whole with voice recognition software. Voice recognition is usually quite accurate but there are transcription errors that can and very often do occur. I apologize for any typographical errors that were not detected and corrected.       Jomarie Longs, MD 12/08/2019, 8:51 AM

## 2020-02-29 ENCOUNTER — Other Ambulatory Visit: Payer: Self-pay

## 2020-02-29 ENCOUNTER — Encounter: Payer: Self-pay | Admitting: Psychiatry

## 2020-02-29 ENCOUNTER — Telehealth (INDEPENDENT_AMBULATORY_CARE_PROVIDER_SITE_OTHER): Payer: BC Managed Care – PPO | Admitting: Psychiatry

## 2020-02-29 DIAGNOSIS — F3178 Bipolar disorder, in full remission, most recent episode mixed: Secondary | ICD-10-CM | POA: Diagnosis not present

## 2020-02-29 DIAGNOSIS — G4701 Insomnia due to medical condition: Secondary | ICD-10-CM | POA: Diagnosis not present

## 2020-02-29 DIAGNOSIS — Z79899 Other long term (current) drug therapy: Secondary | ICD-10-CM

## 2020-02-29 DIAGNOSIS — Z9189 Other specified personal risk factors, not elsewhere classified: Secondary | ICD-10-CM | POA: Insufficient documentation

## 2020-02-29 MED ORDER — ARIPIPRAZOLE 10 MG PO TABS: 10.0000 mg | ORAL_TABLET | Freq: Every day | ORAL | 0 refills | Status: DC

## 2020-02-29 MED ORDER — TRAZODONE HCL 50 MG PO TABS: ORAL_TABLET | ORAL | 0 refills | Status: DC

## 2020-02-29 NOTE — Progress Notes (Signed)
Provider Location : [facility] Patient Location : Home  Virtual Visit via Video Note  I connected with Lindsay Brown on 02/29/20 at  8:45 AM EDT by a video enabled telemedicine application and verified that I am speaking with the correct person using two identifiers.   I discussed the limitations of evaluation and management by telemedicine and the availability of in person appointments. The patient expressed understanding and agreed to proceed.    I discussed the assessment and treatment plan with the patient. The patient was provided an opportunity to ask questions and all were answered. The patient agreed with the plan and demonstrated an understanding of the instructions.   The patient was advised to call back or seek an in-person evaluation if the symptoms worsen or if the condition fails to improve as anticipated.   [place] MD OP Progress Note  02/29/2020 8:52 PM Lindsay Brown  MRN:  [id number]  Chief Complaint:  Chief Complaint    Follow-up     HPI: Lindsay Brown is a 34 year old Hispanic female, single, employed, lives in [place], has a history of bipolar disorder, insomnia was evaluated by telemedicine today.  Patient today reports she is currently doing well with regards to her mood.  She denies any significant mood lability, manic or depressive symptoms.  She reports sleep continues to be good.  She is compliant on medications as prescribed.  She denies side effects.  She reports her work continues to go well.  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  She reports she however is currently thinking about taking a trip with her friend to [place] . She looks forward to that.  Patient denies any suicidality, homicidality or perceptual disturbances.  Patient denies any other concerns today.  Visit Diagnosis:    ICD-10-CM   1. Bipolar disorder, in full remission, most recent episode mixed ([place])  F31.78 TSH  2. Insomnia due to  medical condition  G47.01 ARIPiprazole (ABILIFY) 10 MG tablet    traZODone (DESYREL) 50 MG tablet  3. High risk medication use  Z79.899 Lipid panel    Hemoglobin A1C    Prolactin  4. At risk for prolonged QT interval syndrome  Z91.89 EKG 12-Lead    Past Psychiatric History: I have reviewed past psychiatric history from my progress note on 01/03/2019  Past Medical History:  Past Medical History:  Diagnosis Date   Arrhythmia    Asthma    Bipolar disorder ([place])    Depression    GERD (gastroesophageal reflux disease)     Past Surgical History:  Procedure Laterality Date   TONSILLECTOMY  2011    Family Psychiatric History: Reviewed family psychiatric history from my progress note on 01/03/2019  Family History:  Family History  Problem Relation Age of Onset   Bipolar disorder Mother    Drug abuse Mother    Alcohol abuse Mother    Obesity Mother    Skin cancer Mother    Heart disease Mother    Ovarian cysts Mother    Prostate cancer Father    Depression Father    Drug abuse Father    Alcohol abuse Father    Thyroid cancer Sister    Depression Sister    Anxiety disorder Sister    Alcohol abuse Brother    Drug abuse Brother    Anxiety disorder Brother    Breast cancer Neg Hx    Ovarian cancer Neg Hx     Social History: Reviewed  social history from my progress note on 01/03/2019 Social History   Socioeconomic History   Marital status: Single    Spouse name: Not on file   Number of children: 3   Years of education: Not on file   Highest education level: Bachelor's degree (e.g., BA, AB, BS)  Occupational History   Occupation: [employer]    Comment: full time  Tobacco Use   Smoking status: Never Smoker   Smokeless tobacco: Never Used  Vaping Use   Vaping Use: Never used  Substance and Sexual Activity   Alcohol use: Yes    Alcohol/week: 2.0 standard drinks    Types: 2 Glasses of wine per week    Comment: not at the moment     Drug use: No   Sexual activity: Yes    Birth control/protection: I.U.D.    Comment: Mirena  Other Topics Concern   Not on file  Social History Narrative   Not on file   Social Determinants of Health   Financial Resource Strain:    Difficulty of Paying Living Expenses:   Food Insecurity:    Worried About Programme researcher, broadcasting/film/video in the Last Year:    Barista in the Last Year:   Transportation Needs:    Freight forwarder (Medical):    Lack of Transportation (Non-Medical):   Physical Activity:    Days of Exercise per Week:    Minutes of Exercise per Session:   Stress:    Feeling of Stress :   Social Connections:    Frequency of Communication with Friends and Family:    Frequency of Social Gatherings with Friends and Family:    Attends Religious Services:    Active Member of Clubs or Organizations:    Attends Engineer, structural:    Marital Status:     Allergies: No Known Allergies  Metabolic Disorder Labs: No results found for: HGBA1C, MPG No results found for: PROLACTIN Lab Results  Component Value Date   CHOL 147 02/06/2016   TRIG 186 (H) 02/06/2016   HDL 36 (L) 02/06/2016   LDLCALC 74 02/06/2016   Lab Results  Component Value Date   TSH 0.780 02/06/2016    Therapeutic Level Labs: No results found for: LITHIUM No results found for: VALPROATE No components found for:  CBMZ  Current Medications: Current Outpatient Medications  Medication Sig Dispense Refill   ARIPiprazole (ABILIFY) 10 MG tablet Take 1 tablet (10 mg total) by mouth daily. For mood 90 tablet 0   ibuprofen (ADVIL) 800 MG tablet Take 800 mg by mouth 3 (three) times daily.     levonorgestrel (MIRENA) 20 MCG/24HR IUD by Intrauterine route once for 1 dose. 1 each 0   metroNIDAZOLE (FLAGYL) 500 MG tablet Take 500 mg by mouth 2 (two) times daily.     traZODone (DESYREL) 50 MG tablet TAKE 1 TABLET BY MOUTH EVERYDAY AT BEDTIME 90 tablet 0   No current  facility-administered medications for this visit.     Musculoskeletal: Strength & Muscle Tone: UTA Gait & Station: normal Patient leans: N/A  Psychiatric Specialty Exam: Review of Systems  Psychiatric/Behavioral: Negative for agitation, behavioral problems, confusion, decreased concentration, dysphoric mood, hallucinations, self-injury, sleep disturbance and suicidal ideas. The patient is not nervous/anxious and is not hyperactive.   All other systems reviewed and are negative.   There were no vitals taken for this visit.There is no height or weight on file to calculate BMI.  General Appearance: Casual  Eye  Contact:  Fair  Speech:  Clear and Coherent  Volume:  Normal  Mood:  Euthymic  Affect:  Congruent  Thought Process:  Goal Directed and Descriptions of Associations: Intact  Orientation:  Full (Time, Place, and Person)  Thought Content: Logical   Suicidal Thoughts:  No  Homicidal Thoughts:  No  Memory:  Immediate;   Fair Recent;   Fair Remote;   Fair  Judgement:  Fair  Insight:  Fair  Psychomotor Activity:  Normal  Concentration:  Concentration: Fair and Attention Span: Fair  Recall:  [employer] of Knowledge: Fair  Language: Fair  Akathisia:  No  Handed:  Right  AIMS (if indicated): UTA  Assets:  Communication Skills Desire for Improvement Housing Social Support  ADL's:  Intact  Cognition: WNL  Sleep:  Fair   Screenings:   Assessment and Plan: Lindsay Brown is a 34 year old Hispanic female, single, employed, lives in [place], has a history of bipolar disorder, insomnia was evaluated by telemedicine today.  Patient is currently stable on current medication regimen.  Plan as noted below.  Plan Bipolar disorder in remission Abilify 10 mg p.o. daily Hydroxyzine 10 mg p.o. 3 times daily as needed for severe anxiety symptoms.  Insomnia-stable Trazodone 25 to 50 mg p.o. nightly as needed  At risk for prolonged QT syndrome-will order EKG.  High risk medication  use-will order TSH, lipid panel, hemoglobin A1c, prolactin.  Follow-up in clinic in 2 to 3 months or sooner if needed.  I have spent atleast 20 minutes non face to face with patient today. More than 50 % of the time was spent for preparing to see the patient ( e.g., review of test, records ), , ordering medications and test ,psychoeducation and supportive psychotherapy and care coordination,as well as documenting clinical information in electronic health record. This note was generated in part or whole with voice recognition software. Voice recognition is usually quite accurate but there are transcription errors that can and very often do occur. I apologize for any typographical errors that were not detected and corrected.        Jomarie Longs, MD 02/29/2020, 8:52 PM

## 2020-03-11 ENCOUNTER — Telehealth: Payer: BC Managed Care – PPO | Admitting: Psychiatry

## 2020-03-26 ENCOUNTER — Telehealth: Payer: Self-pay

## 2020-03-26 NOTE — Telephone Encounter (Signed)
I have printed the orders and left it at your door. Please fax them. Thanks

## 2020-03-26 NOTE — Telephone Encounter (Signed)
faxed and confirmed labwork orders  

## 2020-03-26 NOTE — Telephone Encounter (Signed)
pt called left a message that she needed labwork orders sent to [facility] care fax  # [phone]

## 2020-04-03 ENCOUNTER — Other Ambulatory Visit: Payer: Self-pay

## 2020-04-03 DIAGNOSIS — N644 Mastodynia: Secondary | ICD-10-CM

## 2020-04-03 DIAGNOSIS — N6321 Unspecified lump in the left breast, upper outer quadrant: Secondary | ICD-10-CM

## 2020-04-05 ENCOUNTER — Ambulatory Visit
Admission: RE | Admit: 2020-04-05 | Discharge: 2020-04-05 | Disposition: A | Discharge status: Home / Self Care | Payer: BC Managed Care – PPO | Source: Ambulatory Visit

## 2020-04-05 DIAGNOSIS — N6321 Unspecified lump in the left breast, upper outer quadrant: Secondary | ICD-10-CM

## 2020-04-05 DIAGNOSIS — N644 Mastodynia: Secondary | ICD-10-CM

## 2020-04-08 ENCOUNTER — Other Ambulatory Visit: Payer: Self-pay

## 2020-04-08 DIAGNOSIS — N6321 Unspecified lump in the left breast, upper outer quadrant: Secondary | ICD-10-CM

## 2020-04-09 ENCOUNTER — Other Ambulatory Visit: Payer: Self-pay

## 2020-04-09 DIAGNOSIS — N6321 Unspecified lump in the left breast, upper outer quadrant: Secondary | ICD-10-CM

## 2020-04-15 ENCOUNTER — Ambulatory Visit
Admission: RE | Admit: 2020-04-15 | Discharge: 2020-04-15 | Disposition: A | Discharge status: Home / Self Care | Payer: BC Managed Care – PPO | Source: Ambulatory Visit

## 2020-04-15 ENCOUNTER — Other Ambulatory Visit: Payer: Self-pay

## 2020-04-15 ENCOUNTER — Ambulatory Visit: Payer: BC Managed Care – PPO

## 2020-04-15 DIAGNOSIS — N6321 Unspecified lump in the left breast, upper outer quadrant: Secondary | ICD-10-CM

## 2020-04-15 HISTORY — PX: BREAST BIOPSY: SHX20

## 2020-04-16 LAB — SURGICAL PATHOLOGY

## 2020-04-18 ENCOUNTER — Telehealth: Payer: Self-pay

## 2020-04-18 NOTE — Telephone Encounter (Signed)
Lindsay Brown has an appt with Dr. Lady Gary on 04/25/2020 at 10:00am.  Patient is aware of time/date.

## 2020-04-25 ENCOUNTER — Encounter: Payer: Self-pay | Admitting: General Surgery

## 2020-04-25 ENCOUNTER — Other Ambulatory Visit: Payer: Self-pay

## 2020-04-25 ENCOUNTER — Other Ambulatory Visit
Admission: RE | Admit: 2020-04-25 | Discharge: 2020-04-25 | Disposition: A | Discharge status: Home / Self Care | Payer: BC Managed Care – PPO | Attending: General Surgery | Admitting: General Surgery

## 2020-04-25 ENCOUNTER — Ambulatory Visit (INDEPENDENT_AMBULATORY_CARE_PROVIDER_SITE_OTHER): Payer: BC Managed Care – PPO | Admitting: General Surgery

## 2020-04-25 VITALS — BP 124/87 | HR 80 | Temp 98.9°F | Resp 12 | Ht 67.0 in | Wt 187.4 lb

## 2020-04-25 DIAGNOSIS — N61 Mastitis without abscess: Secondary | ICD-10-CM | POA: Diagnosis not present

## 2020-04-25 NOTE — Progress Notes (Signed)
Patient ID: Lindsay Brown, female   DOB: 1986/07/05, [age] y.o.   MRN: [id number]  Chief Complaint  Patient presents with  . New Patient (Initial Visit)    DX Mammogram 04/15/20 left breast mass    HPI Lindsay Brown is a 34 y.o. female.  She presented to her primary care provider on July 20, complaining of a 1 month history of left breast pain. She reported that her breast felt heavy, tender, and similar to what she experienced when she had mastitis during breast-feeding. She said  that it sometimes feels like she is about to let down milk. She denied any trauma to the area and there was no erythema or drainage from either the site or her nipple.  She was referred for diagnostic mammography.  An ultrasound.  There were no findings suspicious for malignancy; the ultrasound was highly suggestive of focal mastitis.  The patient was placed on dicloxacillin for a 7-day course, and scheduled for follow-up imaging.  There was no significant change on the follow-up exam and a biopsy was performed.  PATHOLOGY revealed: A. BREAST, LEFT AT 530 O'CLOCK, 1 CM FROM THE NIPPLE; ULTRASOUND-GUIDED CORE NEEDLE BIOPSY: - CYSTIC NEUTROPHILIC GRANULOMATOUS MASTITIS. - NEGATIVE FOR ATYPICAL PROLIFERATIVE BREAST DISEASE.  Comment: A tissue Gram stain was performed, and highlights gram positive bacteria within cystic spaces.  She has been referred to general surgery for further evaluation and management.   Past Medical History:  Diagnosis Date  . Arrhythmia   . Asthma   . Bipolar disorder ([facility])   . Depression   . GERD (gastroesophageal reflux disease)     Past Surgical History:  Procedure Laterality Date  . BREAST BIOPSY Left 04/15/2020   US Bx. X-clip, path pending  . TONSILLECTOMY  2011    Family History  Problem Relation Age of Onset  . Bipolar disorder Mother   . Drug abuse Mother   . Alcohol abuse Mother   . Obesity Mother   . Skin cancer Mother   . Heart disease Mother   .  Ovarian cysts Mother   . Prostate cancer Father   . Depression Father   . Drug abuse Father   . Alcohol abuse Father   . Thyroid cancer Sister   . Depression Sister   . Anxiety disorder Sister   . Alcohol abuse Brother   . Drug abuse Brother   . Anxiety disorder Brother   . Breast cancer Neg Hx   . Ovarian cancer Neg Hx     Social History Social History   Tobacco Use  . Smoking status: Never Smoker  . Smokeless tobacco: Never Used  Vaping Use  . Vaping Use: Never used  Substance Use Topics  . Alcohol use: Yes    Alcohol/week: 2.0 standard drinks    Types: 2 Glasses of wine per week    Comment: not at the moment   . Drug use: No    No Known Allergies  Current Outpatient Medications  Medication Sig Dispense Refill  . ARIPiprazole (ABILIFY) 10 MG tablet Take 1 tablet (10 mg total) by mouth daily. For mood 90 tablet 0  . dicloxacillin (DYNAPEN) 500 MG capsule Take 500 mg by mouth every 6 (six) hours.    . traZODone (DESYREL) 50 MG tablet TAKE 1 TABLET BY MOUTH EVERYDAY AT BEDTIME 90 tablet 0  . levonorgestrel (MIRENA) 20 MCG/24HR IUD by Intrauterine route once for 1 dose. 1 each 0   No current facility-administered medications for this visit.  Review of Systems Review of Systems  All other systems reviewed and are negative. Or as discussed in the history of present illness  Blood pressure 124/87, pulse 80, temperature 98.9 F (37.2 C), temperature source Oral, resp. rate 12, height 5\' 7"  (1.702 m), weight 187 lb 6.4 oz (85 kg), SpO2 99 %. Body mass index is 29.35 kg/m.  Physical Exam Physical Exam Vitals reviewed. Exam conducted with a chaperone present.  Constitutional:      General: She is not in acute distress.    Appearance: Normal appearance. She is obese.  HENT:     Head: Normocephalic and atraumatic.     Nose:     Comments: Covered with a mask    Mouth/Throat:     Comments: Covered with a mask Eyes:     General: No scleral icterus.       Right  eye: No discharge.        Left eye: No discharge.  Neck:     Comments: The trachea is midline.  There is no palpable cervical or supraclavicular lymphadenopathy.  No dominant thyroid masses or palpable thyromegaly identified. Cardiovascular:     Rate and Rhythm: Normal rate and regular rhythm.     Pulses: Normal pulses.  Pulmonary:     Effort: Pulmonary effort is normal.     Breath sounds: Normal breath sounds.  Chest:     Breasts:        Right: Normal. No inverted nipple, mass, nipple discharge, skin change or tenderness.        Left: Tenderness present. No inverted nipple, mass, nipple discharge or skin change.       Comments: Tender to palpation without overlying skin change. Abdominal:     General: Abdomen is flat. Bowel sounds are normal.     Palpations: Abdomen is soft.  Genitourinary:    Comments: Deferred Musculoskeletal:        General: No swelling or tenderness.  Lymphadenopathy:     Upper Body:     Right upper body: No supraclavicular, axillary or pectoral adenopathy.     Left upper body: No supraclavicular, axillary or pectoral adenopathy.  Skin:    General: Skin is warm and dry.  Neurological:     General: No focal deficit present.     Mental Status: She is alert and oriented to person, place, and time.  Psychiatric:        Mood and Affect: Mood normal.        Behavior: Behavior normal.     Data Reviewed I reviewed the imaging studies performed.  These include diagnostic mammogram and ultrasound on July 23 and a subsequent follow-up ultrasound and biopsy on August 2.  The radiologist impressions are copied here, and I am in agreement.  CLINICAL DATA:  34 year old presenting with focal pain and heaviness involving the LOWER periareolar LEFT breast. Possible palpable lumps on recent clinical examination at the 12 o'clock and 2 o'clock location of the LEFT breast.  This is the patient's initial baseline mammogram. Patient states no family history of breast  cancer.  EXAM: DIGITAL DIAGNOSTIC BILATERAL MAMMOGRAM WITH CAD AND TOMO  ULTRASOUND LEFT BREAST  COMPARISON:  None.  ACR Breast Density Category b: There are scattered areas of fibroglandular density.  FINDINGS: Tomosynthesis and synthesized full field CC and MLO views of both breasts were obtained. Tomosynthesis and synthesized spot compression tangential view of the area of concern in the LEFT breast was also obtained.  No mammographic abnormality in the LOWER  LEFT breast in the area of focal pain. A prominent superficial vein and normal focally dense fibroglandular tissue is present in this location.  Normal scattered fibroglandular tissue in the UPPER LEFT breast and UPPER OUTER QUADRANT in the area of clinical concern. No findings suspicious for malignancy in the LEFT breast.  No findings suspicious for malignancy in the RIGHT breast.  Mammographic images were processed with CAD.  On correlative physical exam, there is a palpable 1-2 cm lump in the LOWER periareolar LEFT breast corresponding to what the patient is feeling. The patient describes tenderness to palpation at this location. There is no palpable abnormality in the UPPER breast or UPPER OUTER QUADRANT.  Targeted LEFT breast ultrasound is performed, showing a heterogeneous focus composed of isoechoic masses and hypoechoic tissue at the 5:30 o'clock position approximately 1 cm from nipple, measuring in total approximately 1.9 cm demonstrating mixed posterior characteristics. The largest isoechoic mass measures approximately 0.7 x 0.4 x 0.6 cm. There is hyperemia in this location on color Doppler evaluation.  IMPRESSION: 1. Likely focal mastitis involving the LOWER LEFT breast without evidence of a discrete abscess at this time. The differential diagnosis might include intraductal papillomas or granulomatous mastitis. 2. No mammographic evidence of malignancy involving the  RIGHT Breast.  Assessment Cystic granulomatous mastitis is a fairly uncommon finding, but the patient does have a number of the risk factors associated with its development, including having recently breast fed (within the past 5 years), a history of prior mastitis, and Hispanic ethnicity.  This can be associated with elevated prolactin levels.  UptoDate discusses it as follows:  "Often, no specific management is necessary for IGM. It is a self-limiting inflammatory condition that resolves slowly; complete resolution may take 5 to 20 months [54,55]. Surgical excision for IGM is often followed by slow wound healing and is not advocated. In a series of 120 women with IGM, most cases resolved spontaneously without surgical intervention or medications [55]. A meta-analysis of 10 retrospective studies from several countries ([place], [place], [place], [place], and the [place]) demonstrated comparable recurrence rates between patients managed with or without surgery (odds ratio 1.25, 95% CI 0.51-3.03) [56]. In some cases, treatment is warranted for infection or symptom control.   IGM complicated by secondary infection and abscess, if treated with antibiotics and drainage, usually results in complication resolution [8,22,28,30]. Antibiotic selection should be dictated by culture and susceptibility testing. We generally start an antibiotic regimen used for periductal mastitis after specimens are collected for testing and then adjust based on microbiology results. Oral regimens are generally appropriate. (See 'Periductal mastitis' above.)   If only Corynebacterium is recovered, the optimal management approach is uncertain, in part because of the multiple species of Corynebacterium and the lack of predictable susceptibility patterns. In such cases, we suggest treatment with doxycycline (100 mg orally twice daily). If this fails to produce a response, linezolid (600 mg orally twice daily) can be used. In  refractory or unusually severe cases, we favor speciating the Corynebacterium isolate and doing formal susceptibility testing, if available. Therapy can then be adjusted based on susceptibility testing [37,40,57]. The optimal length of therapy is also uncertain; a 5- to 7-day course can be used if the response to therapy is rapid and complete. If necessary, the duration may be extended to 10 to 14 days. While these antibiotics treat acute infection, there is little evidence that antibiotics shorten the time to full resolution of granulomatous lobular mastitis.  Localized pain may be managed with nonsteroidal  anti-inflammatory drugs (NSAIDs). Routine use of steroids or methotrexate is not warranted; these therapies can reduce swelling but may not alter the natural history of the condition, especially in those with small localized lesions. Discontinuation has been associated with rebound inflammation [28,49,58-62].   Persistent or refractory symptoms--The optimal management of patients with persistent symptoms and progression of IGM despite antibiotics (if infection is present) and/or NSAIDs is uncertain; UpToDate contributors differ in their approach. Some find that treatment with steroids with or without methotrexate can be useful to reduce fever, pain, swelling, and possibly deformity. However, others do not use these agents, as there are no controlled studies demonstrating that they alter the natural history of the condition. Expectant management with observation has resulted in resolution of IGM in several small series [30,55,63].  The contributors who do use steroids in this setting base the approach on the size and severity of the lesion. In patients with painful, small (<5 cm) unilateral lesions with small amounts of drainage or ulceration, treatment with prednisone (0.5 mg/kg/day) may be initiated. In patients with multiple lesions, lesions ?5 cm in diameter, bilateral lesions, or disease with  significant cutaneous ulceration, drainage, or fistulas, treatment with prednisone (0.5 to 1 mg/kg/day) may be initiated, with or without methotrexate (10 to 15 mg orally per week, along with daily folic acid supplementation) [33,36,58,60,62,64,65]. In one observational study including 19 patients with IGM, most of whom were refractory to antibiotics, steroids, and surgical intervention, use of methotrexate was associated with disease remission in 75 percent of cases [64]. There are also reports indicating that intralesional triamcinolone and topical steroids are effective in treating this condition [66,67]. These approaches have the advantage of limiting the adverse effects of systemic steroid use.  Patients treated with steroids should begin tapering when erythema and pain have resolved (usually after about four weeks). Prednisone is tapered gradually over 8 to 12 weeks. If flares occur during tapering in patients on steroids alone, methotrexate (10 to 15 mg per week) may be added. If flares occur during tapering in patients on steroids and methotrexate, small increases in the methotrexate dose (by 2.5 to 5 mg every few weeks) is appropriate. Once clinical remission has been achieved, the methotrexate dose should be reduced monthly; many patients are able to discontinue therapy within 12 months [68]. Monitoring the lesions with weekly photographs may be helpful. Repeat ultrasonography may be useful if there is suspicion for new lesions or abscess reaccumulation."   Plan The information from UptoDate was discussed with the patient.  I did order a prolactin level, which was within normal limits.  She was advised that no surgical intervention is necessary at this time, however should she develop abscesses, these can be incised and drained.  We could also collect material for culture and sensitivity and tailor antibiotics appropriately if necessary.  I recommended that she use nonsteroidal anti-inflammatory  drugs to help with the discomfort.  I advised her that this process can take a fairly long time to resolve.  For now, I have not scheduled any follow-up, however if she develops worsening pain, erythema, areas of fluctuance, or other symptoms suggestive of abscess formation, she should contact our office for evaluation.  I spent greater than 45 minutes in this patient encounter, collating data and reference materials, as well as in counseling and coordination of care.  Duanne Guess 04/25/2020, 11:52 AM

## 2020-04-25 NOTE — Patient Instructions (Addendum)
Please go to the Lab today at the [facility]. We will call you with the results.  Please call our office if you have questions or concerns or show any signs /symptoms of it worsening.   Mastitis  Mastitis is irritation and swelling (inflammation) in an area of the breast. It is often caused by an infection that occurs when germs (bacteria) enter the skin. This most often happens to breastfeeding mothers, but it can happen to other women too as well as some men. Follow these instructions at home: Medicines  Take over-the-counter and prescription medicines only as told by your doctor.  If you were prescribed an antibiotic medicine, take it as told by your doctor. Do not stop taking it even if you start to feel better. General instructions  Do not wear a tight or underwire bra. Wear a soft support bra.  Drink more fluids, especially if you have a fever.  Get plenty of rest. If you are breastfeeding:   Keep emptying your breasts by breastfeeding or by using a breast pump.  Keep your nipples clean and dry.  During breastfeeding, empty the first breast before going to the other breast. Use a breast pump if your baby is not emptying your breasts.  Massage your breasts during feeding or pumping as told by your doctor.  If told, put moist heat on the affected area of your breast right before breastfeeding or pumping. Use the heat source that your doctor tells you to use.  If told, put ice on the affected area of your breast right after breastfeeding or pumping: ? Put ice in a plastic bag. ? Place a towel between your skin and the bag. ? Leave the ice on for 20 minutes.  If you go back to work, pump your breasts while at work.  Avoid letting your breasts get overly filled with milk (engorged). Contact a doctor if:  You have pus-like fluid leaking from your breast.  You have a fever.  Your symptoms do not get better within 2 days. Get help right away if:  Your pain and  swelling are getting worse.  Your pain is not helped by medicine.  You have a red line going from your breast toward your armpit. Summary  Mastitis is irritation and swelling in an area of the breast.  If you were prescribed an antibiotic medicine, do not stop taking it even if you start to feel better.  Drink more fluids and get plenty of rest.  Contact a doctor if your symptoms do not get better within 2 days. This information is not intended to replace advice given to you by your health care provider. Make sure you discuss any questions you have with your health care provider. Document Revised: 08/13/2017 Document Reviewed: 09/22/2016 Elsevier Patient Education  2020 [employer].

## 2020-04-26 LAB — PROLACTIN: Prolactin: 12.5 ng/mL (ref 4.8–23.3)

## 2020-12-31 ENCOUNTER — Other Ambulatory Visit: Payer: Self-pay | Admitting: Psychiatry

## 2020-12-31 DIAGNOSIS — G4701 Insomnia due to medical condition: Secondary | ICD-10-CM

## 2021-02-07 ENCOUNTER — Other Ambulatory Visit: Payer: Self-pay | Admitting: Psychiatry

## 2021-02-07 DIAGNOSIS — G4701 Insomnia due to medical condition: Secondary | ICD-10-CM

## 2021-02-20 ENCOUNTER — Telehealth (INDEPENDENT_AMBULATORY_CARE_PROVIDER_SITE_OTHER): Payer: BC Managed Care – PPO | Admitting: Psychiatry

## 2021-02-20 ENCOUNTER — Other Ambulatory Visit: Payer: Self-pay

## 2021-02-20 ENCOUNTER — Encounter: Payer: Self-pay | Admitting: Psychiatry

## 2021-02-20 DIAGNOSIS — Z9189 Other specified personal risk factors, not elsewhere classified: Secondary | ICD-10-CM

## 2021-02-20 DIAGNOSIS — Z79899 Other long term (current) drug therapy: Secondary | ICD-10-CM | POA: Diagnosis not present

## 2021-02-20 DIAGNOSIS — G4701 Insomnia due to medical condition: Secondary | ICD-10-CM

## 2021-02-20 DIAGNOSIS — F3178 Bipolar disorder, in full remission, most recent episode mixed: Secondary | ICD-10-CM

## 2021-02-20 MED ORDER — ARIPIPRAZOLE 15 MG PO TABS: 7.5000 mg | ORAL_TABLET | Freq: Every day | ORAL | 1 refills | Status: DC

## 2021-02-20 NOTE — Progress Notes (Signed)
Virtual Visit via Video Note  I connected with Lindsay Brown on 02/20/21 at  1:00 PM EDT by a video enabled telemedicine application and verified that I am speaking with the correct person using two identifiers.  Location Provider Location : Work Patient Location : Home  Participants: Patient , Provider   I discussed the limitations of evaluation and management by telemedicine and the availability of in person appointments. The patient expressed understanding and agreed to proceed.    I discussed the assessment and treatment plan with the patient. The patient was provided an opportunity to ask questions and all were answered. The patient agreed with the plan and demonstrated an understanding of the instructions.   The patient was advised to call back or seek an in-person evaluation if the symptoms worsen or if the condition fails to improve as anticipated.    BH MD OP Progress Note  02/20/2021 1:28 PM Lekia Nier  MRN:  [id number]  Chief Complaint:  Chief Complaint   Follow-up; Depression    HPI: Lindsay Brown is a 35 year old Hispanic female, single, employed, lives in [place], has a history of bipolar disorder, insomnia was evaluated by telemedicine today.  Patient today reports she was doing fairly well for the past several months.  She reports hence she felt she did not need a follow-up visit.  She reports she was still taking her Abilify however recently had to get a prescription from her primary care provider.  Patient reports school is going to close today.  She looks forward to her vacation.  She is planning to go to the beach.  She does report recent psychosocial stressors when her 55 year old son moved in with his father and left her home.  He had a fight with her since she did not allow him to go out with his girlfriend to the restaurant one night.  She however reports she is not too worried about that anymore and has been coping with it well.  She  reports sleep is overall okay.  She does not use the trazodone.  She feels she does not need it.  Patient denies any suicidality, homicidality or perceptual disturbances.  Patient denies any other concerns today.  Visit Diagnosis:    ICD-10-CM   1. Bipolar disorder, in full remission, most recent episode mixed (HCC)  F31.78 ARIPiprazole (ABILIFY) 15 MG tablet    2. Insomnia due to medical condition  G47.01     3. High risk medication use  Z79.899 Prolactin    Hemoglobin A1C    4. At risk for prolonged QT interval syndrome  Z91.89 EKG 12-Lead      Past Psychiatric History: I have reviewed past psychiatric history from progress note on 01/03/2019  Past Medical History:  Past Medical History:  Diagnosis Date   Arrhythmia    Asthma    Bipolar disorder (HCC)    Depression    GERD (gastroesophageal reflux disease)     Past Surgical History:  Procedure Laterality Date   BREAST BIOPSY Left 04/15/2020   [place] Bx. X-clip, path pending   TONSILLECTOMY  2011    Family Psychiatric History: Reviewed family psychiatric history from progress note on 01/03/2019  Family History:  Family History  Problem Relation Age of Onset   Bipolar disorder Mother    Drug abuse Mother    Alcohol abuse Mother    Obesity Mother    Skin cancer Mother    Heart disease Mother    Ovarian cysts Mother  Prostate cancer Father    Depression Father    Drug abuse Father    Alcohol abuse Father    Thyroid cancer Sister    Depression Sister    Anxiety disorder Sister    Alcohol abuse Brother    Drug abuse Brother    Anxiety disorder Brother    Breast cancer Neg Hx    Ovarian cancer Neg Hx     Social History: Reviewed social history from progress note on 01/03/2019 Social History   Socioeconomic History   Marital status: Single    Spouse name: Not on file   Number of children: 3   Years of education: Not on file   Highest education level: Bachelor's degree (e.g., BA, AB, BS)  Occupational  History   Occupation: [employer]    Comment: full time  Tobacco Use   Smoking status: Never   Smokeless tobacco: Never  Vaping Use   Vaping Use: Never used  Substance and Sexual Activity   Alcohol use: Yes    Alcohol/week: 2.0 standard drinks    Types: 2 Glasses of wine per week    Comment: not at the moment    Drug use: No   Sexual activity: Yes    Birth control/protection: I.U.D.    Comment: Mirena  Other Topics Concern   Not on file  Social History Narrative   Not on file   Social Determinants of Health   Financial Resource Strain: Not on file  Food Insecurity: Not on file  Transportation Needs: Not on file  Physical Activity: Not on file  Stress: Not on file  Social Connections: Not on file    Allergies: No Known Allergies  Metabolic Disorder Labs: No results found for: HGBA1C, MPG Lab Results  Component Value Date   PROLACTIN 12.5 04/25/2020   Lab Results  Component Value Date   CHOL 147 02/06/2016   TRIG 186 (H) 02/06/2016   HDL 36 (L) 02/06/2016   LDLCALC 74 02/06/2016   Lab Results  Component Value Date   TSH 0.780 02/06/2016    Therapeutic Level Labs: No results found for: LITHIUM No results found for: VALPROATE No components found for:  CBMZ  Current Medications: Current Outpatient Medications  Medication Sig Dispense Refill   ARIPiprazole (ABILIFY) 15 MG tablet Take 0.5 tablets (7.5 mg total) by mouth daily. 15 tablet 1   dicloxacillin (DYNAPEN) 500 MG capsule Take 500 mg by mouth every 6 (six) hours. (Patient not taking: Reported on 02/20/2021)     levonorgestrel (MIRENA) 20 MCG/24HR IUD by Intrauterine route once for 1 dose. 1 each 0   metroNIDAZOLE (FLAGYL) 500 MG tablet Take 500 mg by mouth 2 (two) times daily. (Patient not taking: Reported on 02/20/2021)     traZODone (DESYREL) 50 MG tablet TAKE 1 TABLET BY MOUTH EVERYDAY AT BEDTIME (Patient not taking: Reported on 02/20/2021) 90 tablet 0   No current facility-administered medications  for this visit.     Musculoskeletal: Strength & Muscle Tone: UTA Gait & Station: UTA Patient leans: N/A  Psychiatric Specialty Exam: Review of Systems  Psychiatric/Behavioral:  Negative for agitation, behavioral problems, confusion, decreased concentration, dysphoric mood, hallucinations, self-injury, sleep disturbance and suicidal ideas. The patient is not nervous/anxious and is not hyperactive.   All other systems reviewed and are negative.  There were no vitals taken for this visit.There is no height or weight on file to calculate BMI.  General Appearance: Casual  Eye Contact:  Fair  Speech:  Clear and Coherent  Volume:  Normal  Mood:  Euthymic  Affect:  Congruent  Thought Process:  Goal Directed and Descriptions of Associations: Intact  Orientation:  Full (Time, Place, and Person)  Thought Content: Logical   Suicidal Thoughts:  No  Homicidal Thoughts:  No  Memory:  Immediate;   Fair Recent;   Fair Remote;   Good  Judgement:  Good  Insight:  Good  Psychomotor Activity:  Normal  Concentration:  Concentration: Good and Attention Span: Good  Recall:  Good  Fund of Knowledge: Good  Language: Good  Akathisia:  No  Handed:  Right  AIMS (if indicated): UTA  Assets:  Communication Skills Desire for Improvement Financial Resources/Insurance Housing Social Support Talents/Skills Transportation Vocational/Educational  ADL's:  Intact  Cognition: WNL  Sleep:  Good   Screenings: GAD-7    Flowsheet Row Video Visit from [facility]20/2021 in [facility]  Total GAD-7 Score 1      PHQ2-9    Flowsheet Row Video Visit from 02/20/2021 in [facility]  PHQ-2 Total Score 0      Flowsheet Row Video Visit from 02/20/2021 in [facility]  C-SSRS RISK CATEGORY Error: Q6 is Yes, you must answer 7        Assessment and Plan:Lindsay Brown is a 35 year old Hispanic female, single, employed, lives  in [place], has a history of bipolar disorder, insomnia was evaluated by telemedicine today.  Patient is currently stable on current medication regimen and is interested in tapering off of the Abilify gradually.  Plan as noted below. The patient demonstrates the following risk factors for suicide: Chronic risk factors for suicide include: hx of bipolar disorder, hx of suicide attempt. Acute risk factors for suicide include: family or marital conflict. Protective factors for this patient include: positive social support, positive therapeutic relationship, responsibility to others (children, family), coping skills, hope for the future, and life satisfaction. Considering these factors, the overall suicide risk at this point appears to be low. Patient is appropriate for outpatient follow up.   Plan Bipolar disorder in remission Reduce Abilify to 7.5 mg p.o. daily Hydroxyzine 10 mg p.o. 3 times daily as needed for severe anxiety symptoms only.  Insomnia-stable She does have trazodone available however she has not been taking it.  She is sleeping well.  At risk for prolonged QT syndrome-we will order EKG again.  High risk medication use-will order hemoglobin A1c, prolactin.  She had TSH labs done as well as lipid panel, CBC, CMP done 04/02/2020-within normal limits.  Reviewed and discussed with patient.  Follow-up in clinic in 3 months or sooner if needed.  This note was generated in part or whole with voice recognition software. Voice recognition is usually quite accurate but there are transcription errors that can and very often do occur. I apologize for any typographical errors that were not detected and corrected.       Jomarie Longs, MD 02/21/2021, 12:31 PM

## 2021-03-06 ENCOUNTER — Other Ambulatory Visit: Payer: Self-pay | Admitting: Psychiatry

## 2021-03-06 DIAGNOSIS — F3178 Bipolar disorder, in full remission, most recent episode mixed: Secondary | ICD-10-CM

## 2021-05-21 ENCOUNTER — Other Ambulatory Visit: Payer: Self-pay

## 2021-05-21 ENCOUNTER — Encounter: Payer: Self-pay | Admitting: Psychiatry

## 2021-05-21 ENCOUNTER — Telehealth (INDEPENDENT_AMBULATORY_CARE_PROVIDER_SITE_OTHER): Payer: BC Managed Care – PPO | Admitting: Psychiatry

## 2021-05-21 DIAGNOSIS — F3178 Bipolar disorder, in full remission, most recent episode mixed: Secondary | ICD-10-CM | POA: Diagnosis not present

## 2021-05-21 DIAGNOSIS — Z9189 Other specified personal risk factors, not elsewhere classified: Secondary | ICD-10-CM

## 2021-05-21 DIAGNOSIS — G4701 Insomnia due to medical condition: Secondary | ICD-10-CM | POA: Diagnosis not present

## 2021-05-21 DIAGNOSIS — Z79899 Other long term (current) drug therapy: Secondary | ICD-10-CM

## 2021-05-21 NOTE — Progress Notes (Signed)
Virtual Visit via Video Note  I connected with Lindsay Brown on 05/21/21 at  4:00 PM EDT by a video enabled telemedicine application and verified that I am speaking with the correct person using two identifiers.  Location Provider Location : [facility] Patient Location : Work  Participants: Patient , Provider    I discussed the limitations of evaluation and management by telemedicine and the availability of in person appointments. The patient expressed understanding and agreed to proceed.  I discussed the assessment and treatment plan with the patient. The patient was provided an opportunity to ask questions and all were answered. The patient agreed with the plan and demonstrated an understanding of the instructions.   The patient was advised to call back or seek an in-person evaluation if the symptoms worsen or if the condition fails to improve as anticipated.  BH MD OP Progress Note  05/21/2021 4:27 PM Lindsay Brown  MRN:  [id number]  Chief Complaint:  Chief Complaint   Follow-up; Depression; Insomnia    HPI: Lindsay Brown is a 35 year old Hispanic female, single, employed, lives in [place], has a history of bipolar disorder, insomnia was evaluated by telemedicine today.  Patient today reports school has started back, she works as a Runner, broadcasting/film/video.  She reports she has been so far coping okay even though work has been stressful.  She continues to have psychosocial stressors at home.  Her 1 year old son who is a sophomore recently was diagnosed with chronic kidney disease.  He currently lives with his dad.  Patient became tearful when she discussed that he does not want to spend a lot of time with her.  She is currently working with her therapist on the same.  She is also trying to help him go to all his doctor's visits and manage this new diagnosis which has been stressful.  Patient however reports overall her mood is stable.  She has been spending time with her friends when  she gets a chance.  She currently has custody of her twin children every other week. But the week that she does not have the kids she has been taking time for herself.  That has been beneficial.  Patient denies any suicidality, homicidality or perceptual disturbances.  She reports she tolerated the dose reduction of Abilify well.  Patient reports sleep is good.  She reports appetite is fair.  Patient denies any other concerns today.  Visit Diagnosis:    ICD-10-CM   1. Bipolar disorder, in full remission, most recent episode mixed (HCC)  F31.78     2. Insomnia due to medical condition  G47.01     3. High risk medication use  Z79.899     4. At risk for prolonged QT interval syndrome  Z91.89       Past Psychiatric History: I have reviewed past psychiatric history from progress note on 01/03/2019  Past Medical History:  Past Medical History:  Diagnosis Date   Arrhythmia    Asthma    Bipolar disorder (HCC)    Depression    GERD (gastroesophageal reflux disease)     Past Surgical History:  Procedure Laterality Date   BREAST BIOPSY Left 04/15/2020   [place] Bx. X-clip, path pending   TONSILLECTOMY  2011    Family Psychiatric History: Reviewed family psychiatric history from progress note on 01/03/2019  Family History:  Family History  Problem Relation Age of Onset   Bipolar disorder Mother    Drug abuse Mother    Alcohol abuse Mother  Obesity Mother    Skin cancer Mother    Heart disease Mother    Ovarian cysts Mother    Prostate cancer Father    Depression Father    Drug abuse Father    Alcohol abuse Father    Thyroid cancer Sister    Depression Sister    Anxiety disorder Sister    Alcohol abuse Brother    Drug abuse Brother    Anxiety disorder Brother    Breast cancer Neg Hx    Ovarian cancer Neg Hx     Social History: Reviewed social history from progress note on 01/03/2019 Social History   Socioeconomic History   Marital status: Single    Spouse name:  Not on file   Number of children: 3   Years of education: Not on file   Highest education level: Bachelor's degree (e.g., BA, AB, BS)  Occupational History   Occupation: [employer]    Comment: full time  Tobacco Use   Smoking status: Never   Smokeless tobacco: Never  Vaping Use   Vaping Use: Never used  Substance and Sexual Activity   Alcohol use: Yes    Alcohol/week: 2.0 standard drinks    Types: 2 Glasses of wine per week    Comment: not at the moment    Drug use: No   Sexual activity: Yes    Birth control/protection: I.U.D.    Comment: Mirena  Other Topics Concern   Not on file  Social History Narrative   Not on file   Social Determinants of Health   Financial Resource Strain: Not on file  Food Insecurity: Not on file  Transportation Needs: Not on file  Physical Activity: Not on file  Stress: Not on file  Social Connections: Not on file    Allergies: No Known Allergies  Metabolic Disorder Labs: No results found for: HGBA1C, MPG Lab Results  Component Value Date   PROLACTIN 12.5 04/25/2020   Lab Results  Component Value Date   CHOL 147 02/06/2016   TRIG 186 (H) 02/06/2016   HDL 36 (L) 02/06/2016   LDLCALC 74 02/06/2016   Lab Results  Component Value Date   TSH 0.780 02/06/2016    Therapeutic Level Labs: No results found for: LITHIUM No results found for: VALPROATE No components found for:  CBMZ  Current Medications: Current Outpatient Medications  Medication Sig Dispense Refill   hydrOXYzine (ATARAX/VISTARIL) 10 MG tablet Take by mouth.     ARIPiprazole (ABILIFY) 15 MG tablet TAKE 1/2 TABLET BY MOUTH DAILY. 45 tablet 1   dicloxacillin (DYNAPEN) 500 MG capsule Take 500 mg by mouth every 6 (six) hours. (Patient not taking: Reported on 02/20/2021)     levonorgestrel (MIRENA) 20 MCG/24HR IUD by Intrauterine route once for 1 dose. 1 each 0   metroNIDAZOLE (FLAGYL) 500 MG tablet Take 500 mg by mouth 2 (two) times daily. (Patient not taking:  Reported on 02/20/2021)     traZODone (DESYREL) 50 MG tablet TAKE 1 TABLET BY MOUTH EVERYDAY AT BEDTIME (Patient not taking: Reported on 02/20/2021) 90 tablet 0   No current facility-administered medications for this visit.     Musculoskeletal: Strength & Muscle Tone:  UTA Gait & Station: normal Patient leans: N/A  Psychiatric Specialty Exam: Review of Systems  Psychiatric/Behavioral:  The patient is nervous/anxious.   All other systems reviewed and are negative.  There were no vitals taken for this visit.There is no height or weight on file to calculate BMI.  General Appearance: Casual  Eye Contact:  Good  Speech:  Clear and Coherent  Volume:  Normal  Mood:  Anxious Coping well  Affect:  Congruent  Thought Process:  Goal Directed and Descriptions of Associations: Intact  Orientation:  Full (Time, Place, and Person)  Thought Content: Logical   Suicidal Thoughts:  No  Homicidal Thoughts:  No  Memory:  Immediate;   Fair Recent;   Fair Remote;   Fair  Judgement:  Fair  Insight:  Fair  Psychomotor Activity:  Normal  Concentration:  Concentration: Good and Attention Span: Fair  Recall:  Fair  Fund of Knowledge: Good  Language: Good  Akathisia:  No  Handed:  Right  AIMS (if indicated): done  Assets:  Communication Skills Desire for Improvement Social Support Talents/Skills Transportation  ADL's:  Intact  Cognition: WNL  Sleep:  Fair   Screenings: GAD-7    Flowsheet Row Video Visit from 02/20/2021 in [facility]  Total GAD-7 Score 1      PHQ2-9    Flowsheet Row Video Visit from 02/20/2021 in [facility]  PHQ-2 Total Score 0      Flowsheet Row Video Visit from 02/20/2021 in [facility]  C-SSRS RISK CATEGORY Error: Q6 is Yes, you must answer 7        Assessment and Plan: Lindsay Brown is a 35 year old Hispanic female, single, employed, lives in [place], has a history of  bipolar disorder, insomnia was evaluated by telemedicine today.  Patient with psychosocial stressors of relationship struggles, job related stressors however is currently stable.  Plan as noted below.  Plan Bipolar disorder in remission Abilify 7.5 mg p.o. daily Hydroxyzine 10 mg p.o. 3 times daily as needed for severe anxiety attacks  Insomnia-stable Continue trazodone 50 mg p.o. nightly as needed.  At risk for prolonged QT syndrome-I have reviewed EKG dated 05/08/2021-normal sinus rhythm.  QTC within normal limits.  High risk medication use-I have reviewed labs dated 05/08/2021-prolactin-within normal limits.  Hemoglobin A1c-within normal limits.  Discussed with patient.  Patient to continue psychotherapy sessions with her therapist Ms. Mardene Sayer.  Will coordinate care.  Follow-up in clinic in 2 months or sooner if needed.  This note was generated in part or whole with voice recognition software. Voice recognition is usually quite accurate but there are transcription errors that can and very often do occur. I apologize for any typographical errors that were not detected and corrected.      Jomarie Longs, MD 05/22/2021, 8:22 AM

## 2021-06-06 ENCOUNTER — Encounter: Payer: Self-pay | Admitting: General Surgery

## 2021-08-04 ENCOUNTER — Telehealth: Payer: BC Managed Care – PPO | Admitting: Psychiatry

## 2021-08-15 NOTE — Telephone Encounter (Signed)
Mirena rcvd/charged 07/26/2019

## 2021-09-01 ENCOUNTER — Telehealth: Payer: BC Managed Care – PPO | Admitting: Psychiatry

## 2021-09-01 ENCOUNTER — Encounter: Payer: Self-pay | Admitting: Psychiatry

## 2021-09-01 ENCOUNTER — Other Ambulatory Visit: Payer: Self-pay

## 2021-09-01 DIAGNOSIS — G4701 Insomnia due to medical condition: Secondary | ICD-10-CM

## 2021-09-01 DIAGNOSIS — F3178 Bipolar disorder, in full remission, most recent episode mixed: Secondary | ICD-10-CM

## 2021-09-01 MED ORDER — ARIPIPRAZOLE 5 MG PO TABS: 5.0000 mg | ORAL_TABLET | Freq: Every day | ORAL | 1 refills | Status: DC

## 2021-09-01 NOTE — Progress Notes (Signed)
Virtual Visit via Video Note  I connected with Lindsay Brown on 09/01/21 at  1:00 PM EST by a video enabled telemedicine application and verified that I am speaking with the correct person using two identifiers. Location Provider Location : [facility] Patient Location : Home  Participants: Patient , Provider   I discussed the limitations of evaluation and management by telemedicine and the availability of in person appointments. The patient expressed understanding and agreed to proceed.   I discussed the assessment and treatment plan with the patient. The patient was provided an opportunity to ask questions and all were answered. The patient agreed with the plan and demonstrated an understanding of the instructions.   The patient was advised to call back or seek an in-person evaluation if the symptoms worsen or if the condition fails to improve as anticipated.   BH MD OP Progress Note  09/01/2021 1:30 PM Keenan Dimitrov  MRN:  [id number]  Chief Complaint:  Chief Complaint   Follow-up; Depression    HPI: Lindsay Brown is a 35 year old Hispanic female, single, employed, lives in [place], has a history of bipolar disorder, insomnia was evaluated by telemedicine today.  Patient today reports she is currently on her break from school.  She reports she is currently trying to take it easy to take this time to relax and take care of some household chores.  Patient reports overall mood symptoms are stable.  She is planning to spend the holidays with her children.  She does worry about her 16 year old son who does not get to spend a lot of time with her.  She however is happy that his health problems are getting better.  She continues to stay in therapy and reports therapy sessions are beneficial.  Patient reports sleep as good.  She does not take the trazodone although it is available.  She does have hydroxyzine as needed available however she has not taken it in a very long  time.  Patient is compliant on the Abilify 7.5 mg daily.  Denies side effects.  She is agreeable to reducing the dosage further.  Patient denies any suicidality, homicidality or perceptual disturbances.  Patient denies any other concerns today.  Visit Diagnosis:    ICD-10-CM   1. Bipolar disorder, in full remission, most recent episode mixed (HCC)  F31.78 ARIPiprazole (ABILIFY) 5 MG tablet    2. Insomnia due to medical condition  G47.01    mood      Past Psychiatric History: I have reviewed past psychiatric history from progress note on 01/03/2019  Past Medical History:  Past Medical History:  Diagnosis Date   Arrhythmia    Asthma    Bipolar disorder (HCC)    Depression    GERD (gastroesophageal reflux disease)     Past Surgical History:  Procedure Laterality Date   BREAST BIOPSY Left 04/15/2020   [place] Bx. X-clip, path pending   TONSILLECTOMY  2011    Family Psychiatric History: Reviewed family psychiatric history from progress note on 01/03/2019  Family History:  Family History  Problem Relation Age of Onset   Bipolar disorder Mother    Drug abuse Mother    Alcohol abuse Mother    Obesity Mother    Skin cancer Mother    Heart disease Mother    Ovarian cysts Mother    Prostate cancer Father    Depression Father    Drug abuse Father    Alcohol abuse Father    Thyroid cancer Sister  Depression Sister    Anxiety disorder Sister    Alcohol abuse Brother    Drug abuse Brother    Anxiety disorder Brother    Breast cancer Neg Hx    Ovarian cancer Neg Hx     Social History: Reviewed social history from progress note on 01/03/2019 Social History   Socioeconomic History   Marital status: Single    Spouse name: Not on file   Number of children: 3   Years of education: Not on file   Highest education level: Bachelor's degree (e.g., BA, AB, BS)  Occupational History   Occupation: [employer]    Comment: full time  Tobacco Use   Smoking status: Never    Smokeless tobacco: Never  Vaping Use   Vaping Use: Never used  Substance and Sexual Activity   Alcohol use: Yes    Alcohol/week: 2.0 standard drinks    Types: 2 Glasses of wine per week    Comment: not at the moment    Drug use: No   Sexual activity: Yes    Birth control/protection: I.U.D.    Comment: Mirena  Other Topics Concern   Not on file  Social History Narrative   Not on file   Social Determinants of Health   Financial Resource Strain: Not on file  Food Insecurity: Not on file  Transportation Needs: Not on file  Physical Activity: Not on file  Stress: Not on file  Social Connections: Not on file    Allergies: No Known Allergies  Metabolic Disorder Labs: No results found for: HGBA1C, MPG Lab Results  Component Value Date   PROLACTIN 12.5 04/25/2020   Lab Results  Component Value Date   CHOL 147 02/06/2016   TRIG 186 (H) 02/06/2016   HDL 36 (L) 02/06/2016   LDLCALC 74 02/06/2016   Lab Results  Component Value Date   TSH 0.780 02/06/2016    Therapeutic Level Labs: No results found for: LITHIUM No results found for: VALPROATE No components found for:  CBMZ  Current Medications: Current Outpatient Medications  Medication Sig Dispense Refill   ARIPiprazole (ABILIFY) 5 MG tablet Take 1 tablet (5 mg total) by mouth daily. 90 tablet 1   dicloxacillin (DYNAPEN) 500 MG capsule Take 500 mg by mouth every 6 (six) hours. (Patient not taking: Reported on 02/20/2021)     hydrOXYzine (ATARAX/VISTARIL) 10 MG tablet Take by mouth.     levonorgestrel (MIRENA) 20 MCG/24HR IUD by Intrauterine route once for 1 dose. 1 each 0   metroNIDAZOLE (FLAGYL) 500 MG tablet Take 500 mg by mouth 2 (two) times daily. (Patient not taking: Reported on 02/20/2021)     traZODone (DESYREL) 50 MG tablet TAKE 1 TABLET BY MOUTH EVERYDAY AT BEDTIME (Patient not taking: Reported on 02/20/2021) 90 tablet 0   No current facility-administered medications for this visit.      Musculoskeletal: Strength & Muscle Tone:  UTA Gait & Station:  Seated Patient leans: N/A  Psychiatric Specialty Exam: Review of Systems  Psychiatric/Behavioral:  The patient is nervous/anxious.   All other systems reviewed and are negative.  There were no vitals taken for this visit.There is no height or weight on file to calculate BMI.  General Appearance: Casual  Eye Contact:  Fair  Speech:  Clear and Coherent  Volume:  Normal  Mood:  Anxious, coping well  Affect:  Congruent  Thought Process:  Goal Directed and Descriptions of Associations: Intact  Orientation:  Full (Time, Place, and Person)  Thought Content: Logical  Suicidal Thoughts:  No  Homicidal Thoughts:  No  Memory:  Immediate;   Fair Recent;   Fair Remote;   Fair  Judgement:  Fair  Insight:  Fair  Psychomotor Activity:  Normal  Concentration:  Concentration: Fair and Attention Span: Fair  Recall:  [employer] of Knowledge: Fair  Language: Fair  Akathisia:  No  Handed:  Right  AIMS (if indicated): done, 0  Assets:  Communication Skills Desire for Improvement Housing Talents/Skills Transportation  ADL's:  Intact  Cognition: WNL  Sleep:  Fair   Screenings: GAD-7    Flowsheet Row Video Visit [facility]20/2021 in [facility]  Total GAD-7 Score 1      PHQ2-9    Flowsheet Row Video Visit from 09/01/2021 in [facility] Video Visit from 02/20/2021 in [facility]  PHQ-2 Total Score 0 0      Flowsheet Row Video Visit from 02/20/2021 in [facility]  C-SSRS RISK CATEGORY Error: Q6 is Yes, you must answer 7        Assessment and Plan: Paraskevi Funez is a 35 year old Hispanic female, single, employed, lives in [place], has a history of bipolar disorder, insomnia was evaluated by telemedicine today.  Patient is currently stable with regards to her mood and is agreeable to reducing the  dosage of Abilify further.  Plan as noted below.  Plan Bipolar disorder in remission Reduce Abilify to 5 mg p.o. daily Hydroxyzine 10 mg p.o. 3 times daily as needed for severe anxiety attacks.  Insomnia-stable Continue trazodone 50 mg p.o. nightly as needed  At risk for prolonged QT syndrome-EKG was ordered-pending  Follow-up in clinic in 1 month or sooner if needed.  This note was generated in part or whole with voice recognition software. Voice recognition is usually quite accurate but there are transcription errors that can and very often do occur. I apologize for any typographical errors that were not detected and corrected.       Jomarie Longs, MD 09/02/2021, 9:23 AM

## 2021-09-29 ENCOUNTER — Telehealth: Payer: BC Managed Care – PPO | Admitting: Psychiatry

## 2021-10-14 ENCOUNTER — Telehealth: Payer: BC Managed Care – PPO | Admitting: Psychiatry

## 2021-10-29 ENCOUNTER — Other Ambulatory Visit: Payer: Self-pay

## 2021-10-29 ENCOUNTER — Encounter: Payer: Self-pay | Admitting: Psychiatry

## 2021-10-29 ENCOUNTER — Telehealth: Payer: BC Managed Care – PPO | Admitting: Psychiatry

## 2021-10-29 DIAGNOSIS — F3178 Bipolar disorder, in full remission, most recent episode mixed: Secondary | ICD-10-CM

## 2021-10-29 DIAGNOSIS — G4701 Insomnia due to medical condition: Secondary | ICD-10-CM

## 2021-10-29 NOTE — Progress Notes (Signed)
Virtual Visit via Video Note  I connected with Lindsay Brown on 10/29/21 at  4:30 PM EST by a video enabled telemedicine application and verified that I am speaking with the correct person using two identifiers.  Location Provider Location : [facility] Patient Location : Home  Participants: Patient , Provider    I discussed the limitations of evaluation and management by telemedicine and the availability of in person appointments. The patient expressed understanding and agreed to proceed.    I discussed the assessment and treatment plan with the patient. The patient was provided an opportunity to ask questions and all were answered. The patient agreed with the plan and demonstrated an understanding of the instructions.   The patient was advised to call back or seek an in-person evaluation if the symptoms worsen or if the condition fails to improve as anticipated.    BH MD OP Progress Note  10/29/2021 5:06 PM Tawnee Rydzewski  MRN:  [id number]  Chief Complaint:  Chief Complaint  Patient presents with   Follow-up 36 year old Hispanic female, currently lives in [place], has a history of bipolar disorder, insomnia was evaluated by telemedicine today.   HPI: Lindsay Brown is a 36 year old Hispanic female, single, employed, lives in [place], has a history of bipolar disorder, insomnia was evaluated by telemedicine today.  Patient today reports she is currently sick with COVID 19 infection.  She reports she tested positive couple of days ago.  Currently has fatigue, brain fog, GI symptoms, loss of sense of taste and smell.  Patient reports she does have support from her family.  She is agreeable to reaching out to her primary care provider as needed if her symptoms gets worse.  Patient reports overall mood wise she is managing okay on the current medication regimen.  She denies side effects to the Abilify.  Patient denies any suicidality or homicidality.  Patient reports sleep  is good.  Currently does not take trazodone.  Reports she is worried about all the deadlines and projects that she has to get done at school.  She wants to get back to work as soon as possible.  Patient denies any other concerns today.  Visit Diagnosis:    ICD-10-CM   1. Bipolar disorder, in full remission, most recent episode mixed (HCC)  F31.78     2. Insomnia due to medical condition  G47.01    mood      Past Psychiatric History: Reviewed past psychiatric history from progress note on 01/03/2019.  Past Medical History:  Past Medical History:  Diagnosis Date   Arrhythmia    Asthma    Bipolar disorder (HCC)    Depression    GERD (gastroesophageal reflux disease)     Past Surgical History:  Procedure Laterality Date   BREAST BIOPSY Left 04/15/2020   [place] Bx. X-clip, path pending   TONSILLECTOMY  2011    Family Psychiatric History: Reviewed family psychiatric history from progress note on 01/03/2019.  Family History:  Family History  Problem Relation Age of Onset   Bipolar disorder Mother    Drug abuse Mother    Alcohol abuse Mother    Obesity Mother    Skin cancer Mother    Heart disease Mother    Ovarian cysts Mother    Prostate cancer Father    Depression Father    Drug abuse Father    Alcohol abuse Father    Thyroid cancer Sister    Depression Sister    Anxiety disorder Sister  Alcohol abuse Brother    Drug abuse Brother    Anxiety disorder Brother    Breast cancer Neg Hx    Ovarian cancer Neg Hx     Social History: Reviewed social history from progress note on 01/03/2019. Social History   Socioeconomic History   Marital status: Single    Spouse name: Not on file   Number of children: 3   Years of education: Not on file   Highest education level: Bachelor's degree (e.g., BA, AB, BS)  Occupational History   Occupation: [employer]    Comment: full time  Tobacco Use   Smoking status: Never   Smokeless tobacco: Never  Vaping Use   Vaping  Use: Never used  Substance and Sexual Activity   Alcohol use: Yes    Alcohol/week: 2.0 standard drinks    Types: 2 Glasses of wine per week    Comment: not at the moment    Drug use: No   Sexual activity: Yes    Birth control/protection: I.U.D.    Comment: Mirena  Other Topics Concern   Not on file  Social History Narrative   Not on file   Social Determinants of Health   Financial Resource Strain: Not on file  Food Insecurity: Not on file  Transportation Needs: Not on file  Physical Activity: Not on file  Stress: Not on file  Social Connections: Not on file    Allergies: No Known Allergies  Metabolic Disorder Labs: No results found for: HGBA1C, MPG Lab Results  Component Value Date   PROLACTIN 12.5 04/25/2020   Lab Results  Component Value Date   CHOL 147 02/06/2016   TRIG 186 (H) 02/06/2016   HDL 36 (L) 02/06/2016   LDLCALC 74 02/06/2016   Lab Results  Component Value Date   TSH 0.780 02/06/2016    Therapeutic Level Labs: No results found for: LITHIUM No results found for: VALPROATE No components found for:  CBMZ  Current Medications: Current Outpatient Medications  Medication Sig Dispense Refill   ARIPiprazole (ABILIFY) 5 MG tablet Take 1 tablet (5 mg total) by mouth daily. 90 tablet 1   dicloxacillin (DYNAPEN) 500 MG capsule Take 500 mg by mouth every 6 (six) hours. (Patient not taking: Reported on 02/20/2021)     hydrOXYzine (ATARAX/VISTARIL) 10 MG tablet Take by mouth.     levonorgestrel (MIRENA) 20 MCG/24HR IUD by Intrauterine route once for 1 dose. 1 each 0   metroNIDAZOLE (FLAGYL) 500 MG tablet Take 500 mg by mouth 2 (two) times daily. (Patient not taking: Reported on 02/20/2021)     traZODone (DESYREL) 50 MG tablet TAKE 1 TABLET BY MOUTH EVERYDAY AT BEDTIME (Patient not taking: Reported on 02/20/2021) 90 tablet 0   No current facility-administered medications for this visit.     Musculoskeletal: Strength & Muscle Tone:  UTA Gait & Station:   WNL Patient leans: N/A  Psychiatric Specialty Exam: Review of Systems  Constitutional:  Positive for fatigue.  HENT:  Positive for congestion.   Gastrointestinal:  Positive for abdominal pain, diarrhea and nausea.  Psychiatric/Behavioral:  The patient is nervous/anxious.   All other systems reviewed and are negative.  There were no vitals taken for this visit.There is no height or weight on file to calculate BMI.  General Appearance: Casual  Eye Contact:  Fair  Speech:  Normal Rate  Volume:  Normal  Mood:  Anxious  Affect:  Congruent  Thought Process:  Goal Directed and Descriptions of Associations: Intact  Orientation:  Full (Time, Place, and Person)  Thought Content: Logical   Suicidal Thoughts:  No  Homicidal Thoughts:  No  Memory:  Immediate;   Fair Recent;   Fair Remote;   Fair  Judgement:  Fair  Insight:  Fair  Psychomotor Activity:  Normal  Concentration:  Concentration: Fair and Attention Span: Fair  Recall:  [employer] of Knowledge: Fair  Language: Fair  Akathisia:  No  Handed:  Right  AIMS (if indicated): not done  Assets:  Communication Skills Desire for Improvement Housing Social Support  ADL's:  Intact  Cognition: WNL  Sleep:  Fair   Screenings: GAD-7    Flowsheet Row Video Visit from 02/20/2021 in [facility]  Total GAD-7 Score 1      PHQ2-9    Flowsheet Row Video Visit from 09/01/2021 in [facility] Video Visit from 02/20/2021 in [facility]  PHQ-2 Total Score 0 0      Flowsheet Row Video Visit from 02/20/2021 in [facility]  C-SSRS RISK CATEGORY Error: Q6 is Yes, you must answer 7        Assessment and Plan: Najai Waszak is a 36 year old Hispanic female single, employed, lives in [place], has a history of bipolar disorder, insomnia was evaluated by telemedicine today.  Patient is currently struggling with COVID-19  infection otherwise reports mood symptoms are stable, managing her anxiety well.  Discussed plan as noted below.  Plan Bipolar disorder in remission Abilify at reduced dosage of 5 mg p.o. daily Hydroxyzine 10 mg p.o. 3 times daily as needed for severe anxiety attacks   Insomnia-stable Continue sleep hygiene techniques Does have trazodone 50 mg p.o. nightly as needed available, however not taking it at this time.    Patient agrees to follow up with primary care provider as needed for COVID-19 infection.   Follow-up in clinic in 3 months or sooner in person.   Consent: Patient/Guardian gives verbal consent for treatment and assignment of benefits for services provided during this visit. Patient/Guardian expressed understanding and agreed to proceed.   This note was generated in part or whole with voice recognition software. Voice recognition is usually quite accurate but there are transcription errors that can and very often do occur. I apologize for any typographical errors that were not detected and corrected.    Jomarie Longs, MD 10/30/2021, 8:17 AM

## 2022-02-04 ENCOUNTER — Ambulatory Visit: Payer: BC Managed Care – PPO | Admitting: Psychiatry

## 2022-03-02 ENCOUNTER — Telehealth: Payer: Self-pay | Admitting: Psychiatry

## 2022-03-02 NOTE — Telephone Encounter (Signed)
Pt. Left VM requesting confirmation of appointment type for 03/02/2022 visit. Left VM to confirm the appt. is scheduled to be in person and requested call back to confirm or request change.

## 2022-03-03 ENCOUNTER — Encounter: Payer: Self-pay | Admitting: Psychiatry

## 2022-03-03 ENCOUNTER — Ambulatory Visit: Payer: BC Managed Care – PPO | Admitting: Psychiatry

## 2022-03-03 VITALS — BP 126/82 | HR 88 | Temp 98.5°F | Ht 66.5 in | Wt 202.0 lb

## 2022-03-03 DIAGNOSIS — G4701 Insomnia due to medical condition: Secondary | ICD-10-CM

## 2022-03-03 DIAGNOSIS — F17200 Nicotine dependence, unspecified, uncomplicated: Secondary | ICD-10-CM

## 2022-03-03 DIAGNOSIS — F3178 Bipolar disorder, in full remission, most recent episode mixed: Secondary | ICD-10-CM | POA: Diagnosis not present

## 2022-03-03 MED ORDER — HYDROXYZINE HCL 10 MG PO TABS: 10.0000 mg | ORAL_TABLET | Freq: Every evening | ORAL | 3 refills | Status: DC | PRN

## 2022-03-03 MED ORDER — TRAZODONE HCL 50 MG PO TABS: ORAL_TABLET | ORAL | 3 refills | Status: DC

## 2022-03-03 MED ORDER — ARIPIPRAZOLE 5 MG PO TABS: 5.0000 mg | ORAL_TABLET | Freq: Every day | ORAL | 1 refills | Status: DC

## 2022-03-03 NOTE — Progress Notes (Signed)
BH MD OP Progress Note  03/03/2022 5:14 PM Lindsay Brown  MRN:  [id number]  Chief Complaint:  Chief Complaint  Patient presents with   Follow-up   Anxiety   HPI: Lindsay Brown is a 36 year old Hispanic female, single, employed, lives in [place], has a history of bipolar disorder, insomnia was evaluated in office today.  Patient reports she is currently on her summer break from school.  She works as a Runner, broadcasting/film/video, Counselling psychologist.  However since she has financial problems and will not be having an income for the next couple of months she has started working at a summer camp.  Patient reports although it does not pay her as much as she wants to, it still helps since it is very close to her home and also her twins who are 38 years old can participate for free.  Patient reports she continues to have anxiety about her financial situation although she is coping  okay.  She does have sleep problems when she does not have her kids at home.  The week that she has her kids she gets too tired and is able to sleep okay.  She does have trazodone available however has been noncompliant.  Agreeable to start taking her trazodone.  Denies any significant manic, hypomanic or depressive symptoms.   Reports appetite is fair.  Patient denies any side effects to her medications and reports she is compliant on the Abilify.  Denies any suicidality, homicidality or perceptual disturbances.      Visit Diagnosis:    ICD-10-CM   1. Bipolar disorder, in full remission, most recent episode mixed (HCC)  F31.78 hydrOXYzine (ATARAX) 10 MG tablet    ARIPiprazole (ABILIFY) 5 MG tablet    2. Insomnia due to medical condition  G47.01 traZODone (DESYREL) 50 MG tablet   mood    3. Tobacco use disorder  F17.200       Past Psychiatric History: Reviewed past psychiatric history from progress note on 01/03/2019.  Past Medical History:  Past Medical History:  Diagnosis Date   Arrhythmia     Asthma    Bipolar disorder (HCC)    Depression    GERD (gastroesophageal reflux disease)     Past Surgical History:  Procedure Laterality Date   BREAST BIOPSY Left 04/15/2020   [place] Bx. X-clip, path pending   TONSILLECTOMY  2011    Family Psychiatric History: Reviewed family psychiatric history from progress note on 01/03/2019.  Family History:  Family History  Problem Relation Age of Onset   Bipolar disorder Mother    Drug abuse Mother    Alcohol abuse Mother    Obesity Mother    Skin cancer Mother    Heart disease Mother    Ovarian cysts Mother    Prostate cancer Father    Depression Father    Drug abuse Father    Alcohol abuse Father    Thyroid cancer Sister    Depression Sister    Anxiety disorder Sister    Alcohol abuse Brother    Drug abuse Brother    Anxiety disorder Brother    Breast cancer Neg Hx    Ovarian cancer Neg Hx     Social History: Reviewed social history from progress note on 01/03/2019. Social History   Socioeconomic History   Marital status: Single    Spouse name: Not on file   Number of children: 3   Years of education: Not on file   Highest education level: Bachelor's degree (  e.g., BA, AB, BS)  Occupational History   Occupation: [employer]    Comment: full time  Tobacco Use   Smoking status: Some Days    Types: Cigarettes   Smokeless tobacco: Never   Tobacco comments:    Smokes 2-3 every 3-4 weeks, only when out with friends.   Vaping Use   Vaping Use: Never used  Substance and Sexual Activity   Alcohol use: Yes    Alcohol/week: 2.0 standard drinks of alcohol    Types: 2 Glasses of wine per week    Comment: not at the moment    Drug use: No   Sexual activity: Not Currently    Birth control/protection: I.U.D.    Comment: Mirena  Other Topics Concern   Not on file  Social History Narrative   Not on file   Social Determinants of Health   Financial Resource Strain: Low Risk  (08/03/2017)   Overall Financial Resource  Strain (CARDIA)    Difficulty of Paying Living Expenses: Not hard at all  Food Insecurity: No Food Insecurity (08/03/2017)   Hunger Vital Sign    Worried About Running Out of Food in the Last Year: Never true    Ran Out of Food in the Last Year: Never true  Transportation Needs: No Transportation Needs (08/03/2017)   PRAPARE - Administrator, Civil Service (Medical): No    Lack of Transportation (Non-Medical): No  Physical Activity: Inactive (08/03/2017)   Exercise Vital Sign    Days of Exercise per Week: 0 days    Minutes of Exercise per Session: 0 min  Stress: Stress Concern Present (08/03/2017)   [employer] of Occupational Health - Occupational Stress Questionnaire    Feeling of Stress : To some extent  Social Connections: Moderately Isolated (08/03/2017)   Social Connection and Isolation Panel [NHANES]    Frequency of Communication with Friends and Family: Once a week    Frequency of Social Gatherings with Friends and Family: Once a week    Attends Religious Services: Never    Database administrator or Organizations: No    Attends Engineer, structural: Never    Marital Status: Married    Allergies: No Known Allergies  Metabolic Disorder Labs: No results found for: "HGBA1C", "MPG" Lab Results  Component Value Date   PROLACTIN 12.5 04/25/2020   Lab Results  Component Value Date   CHOL 147 02/06/2016   TRIG 186 (H) 02/06/2016   HDL 36 (L) 02/06/2016   LDLCALC 74 02/06/2016   Lab Results  Component Value Date   TSH 0.780 02/06/2016    Therapeutic Level Labs: No results found for: "LITHIUM" No results found for: "VALPROATE" No results found for: "CBMZ"  Current Medications: Current Outpatient Medications  Medication Sig Dispense Refill   levonorgestrel (MIRENA) 20 MCG/24HR IUD by Intrauterine route once for 1 dose. 1 each 0   ARIPiprazole (ABILIFY) 5 MG tablet Take 1 tablet (5 mg total) by mouth daily. 90 tablet 1   hydrOXYzine  (ATARAX) 10 MG tablet Take 1-2 tablets (10-20 mg total) by mouth at bedtime as needed. For sleep 60 tablet 3   meclizine (ANTIVERT) 25 MG tablet Take 25 mg by mouth 3 (three) times daily as needed. (Patient not taking: Reported on 03/03/2022)     meloxicam (MOBIC) 15 MG tablet Take 15 mg by mouth daily. (Patient not taking: Reported on 03/03/2022)     traZODone (DESYREL) 50 MG tablet TAKE 1 TABLET BY MOUTH EVERYDAY AT  BEDTIME 30 tablet 3   No current facility-administered medications for this visit.     Musculoskeletal: Strength & Muscle Tone: within normal limits Gait & Station: normal Patient leans: N/A  Psychiatric Specialty Exam: Review of Systems  Psychiatric/Behavioral:  Positive for sleep disturbance. The patient is nervous/anxious.   All other systems reviewed and are negative.   Blood pressure 126/82, pulse 88, temperature 98.5 F (36.9 C), height 5' 6.5" (1.689 m), weight 202 lb (91.6 kg), SpO2 99 %.Body mass index is 32.12 kg/m.  General Appearance: Casual  Eye Contact:  Fair  Speech:  Clear and Coherent  Volume:  Normal  Mood:  Anxious  Affect:  Congruent  Thought Process:  Goal Directed and Descriptions of Associations: Intact  Orientation:  Full (Time, Place, and Person)  Thought Content: Logical   Suicidal Thoughts:  No  Homicidal Thoughts:  No  Memory:  Immediate;   Fair Recent;   Fair Remote;   Fair  Judgement:  Fair  Insight:  Fair  Psychomotor Activity:  Normal  Concentration:  Concentration: Fair and Attention Span: Fair  Recall:  [employer] of Knowledge: Fair  Language: Fair  Akathisia:  No  Handed:  Right  AIMS (if indicated): done  Assets:  Communication Skills Desire for Improvement Housing Social Support  ADL's:  Intact  Cognition: WNL  Sleep:  Poor   Screenings: AIMS    Flowsheet Row Office Visit from 03/03/2022 in [facility]  AIMS Total Score 0      GAD-7    Flowsheet Row Office Visit from 03/03/2022  in [facility] Video Visit from 02/20/2021 in [facility]  Total GAD-7 Score 2 1      PHQ2-9    Flowsheet Row Office Visit from 03/03/2022 in [facility] Video Visit from 09/01/2021 in [facility] Video Visit from 02/20/2021 in [facility]  PHQ-2 Total Score 0 0 0      Flowsheet Row Video Visit from 02/20/2021 in [facility]  C-SSRS RISK CATEGORY Error: Q6 is Yes, you must answer 7        Assessment and Plan: Lindsay Brown is a 36 year old Hispanic female, single, employed, lives in [place], has a history of bipolar disorder, insomnia, was evaluated in office today.  Patient with situational stressors of financial problems, anxiety due to the same as well as sleep issues, will benefit from the following plan.  Plan Bipolar disorder in remission Abilify at reduced dose to 5 mg p.o. daily Hydroxyzine 10 mg p.o. 3 times daily as needed  Insomnia-unstable Start trazodone 50 mg p.o. nightly as needed, patient was noncompliant with trazodone which was prescribed previously. Discussed sleep hygiene techniques. Continue hydroxyzine as noted above.  Tobacco use disorder-unstable Provided counseling for 1 minute.  Follow-up in clinic in 3 to 4 months or sooner if needed.  This note was generated in part or whole with voice recognition software. Voice recognition is usually quite accurate but there are transcription errors that can and very often do occur. I apologize for any typographical errors that were not detected and corrected.     Jomarie Longs, MD 03/04/2022, 9:25 AM

## 2022-03-17 IMAGING — MG DIGITAL DIAGNOSTIC BILAT W/ TOMO W/ CAD
5 of 10 series · 5 of 30 positions shown · non-contrast
Comparison: None.

CLINICAL DATA: 33-year-old presenting with focal pain and heaviness
involving the LOWER periareolar LEFT breast. Possible palpable lumps
on recent clinical examination at the 12 o'clock and 2 o'clock
location of the LEFT breast.

This is the patient's initial baseline mammogram. Patient states no
family history of breast cancer.
EXAM:
DIGITAL DIAGNOSTIC BILATERAL MAMMOGRAM WITH CAD AND TOMO
ULTRASOUND LEFT BREAST

[R CC synth-2D]
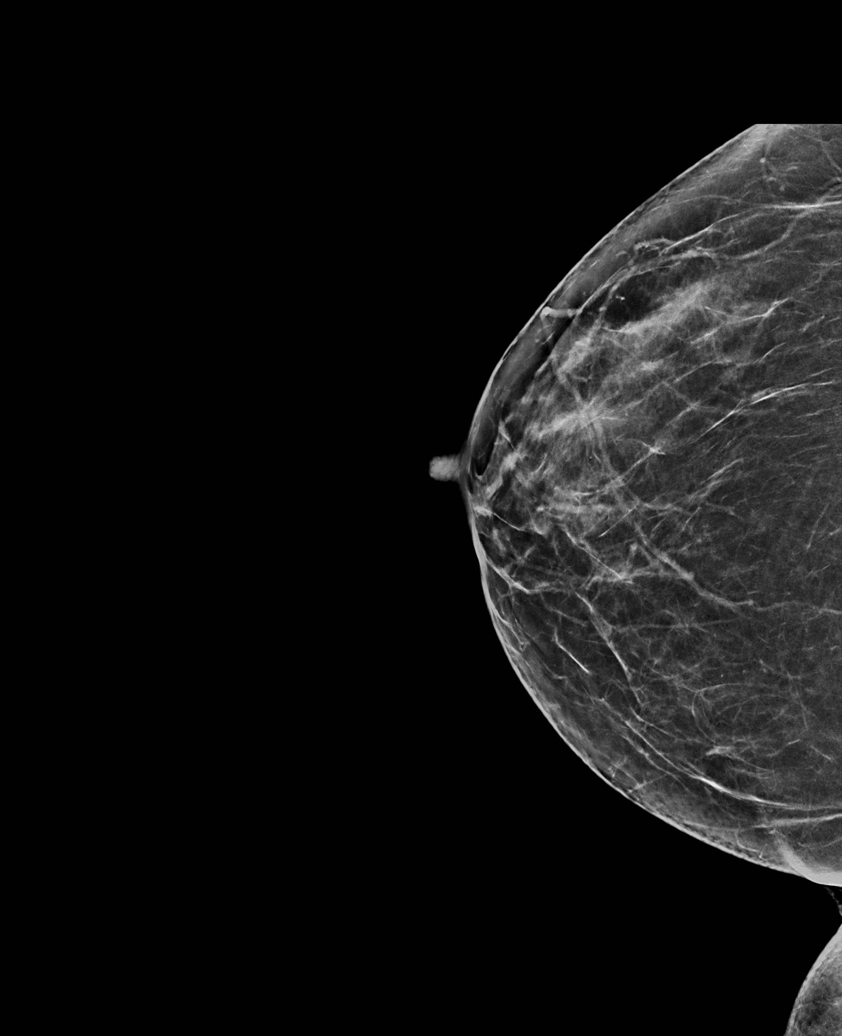

[L MLO synth-2D]
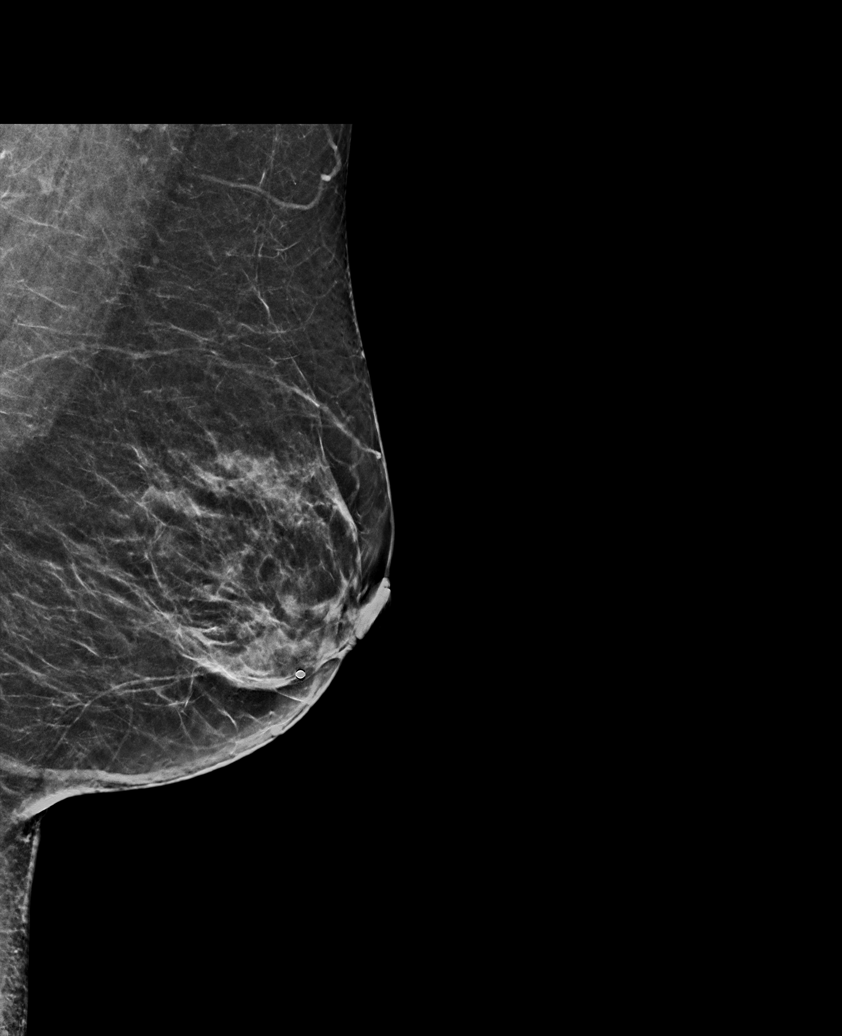

[L CC synth-2D]
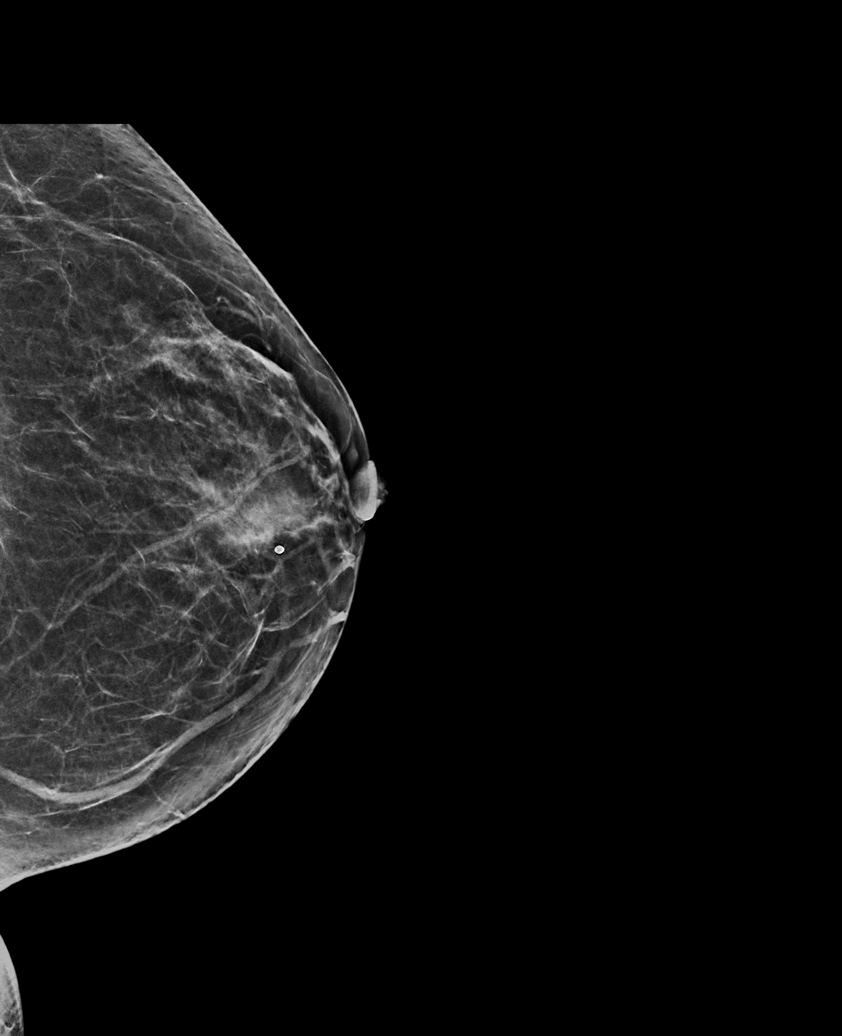

[L TAN synth-2D]
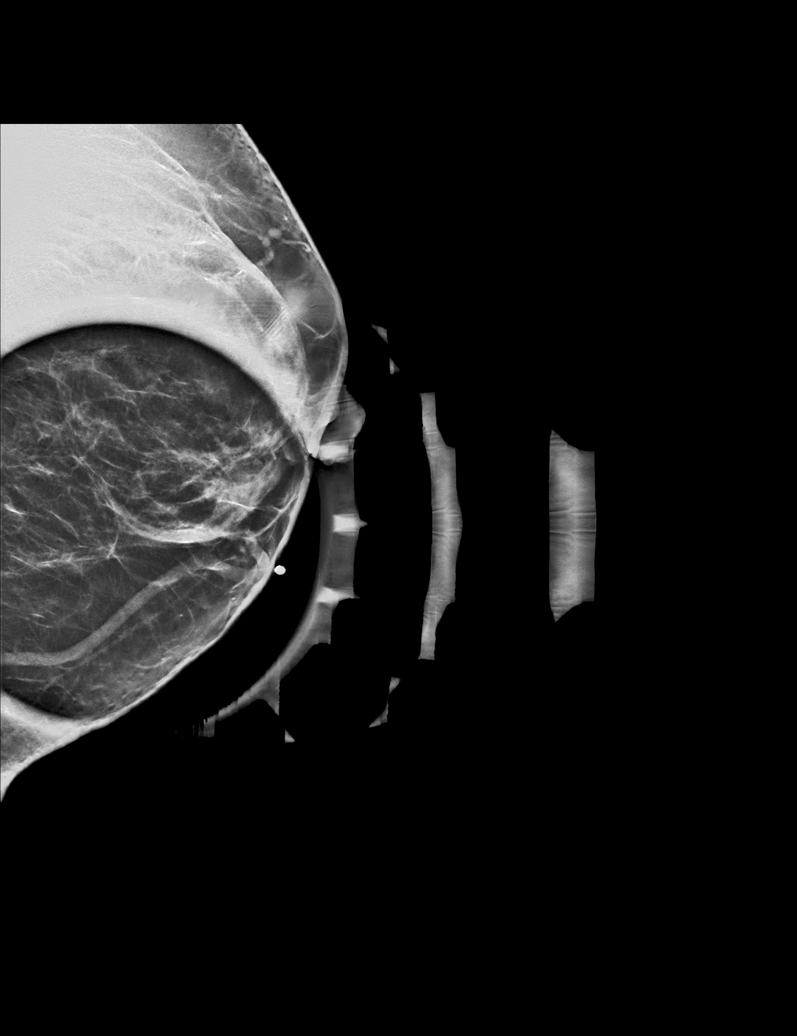

[R MLO synth-2D]
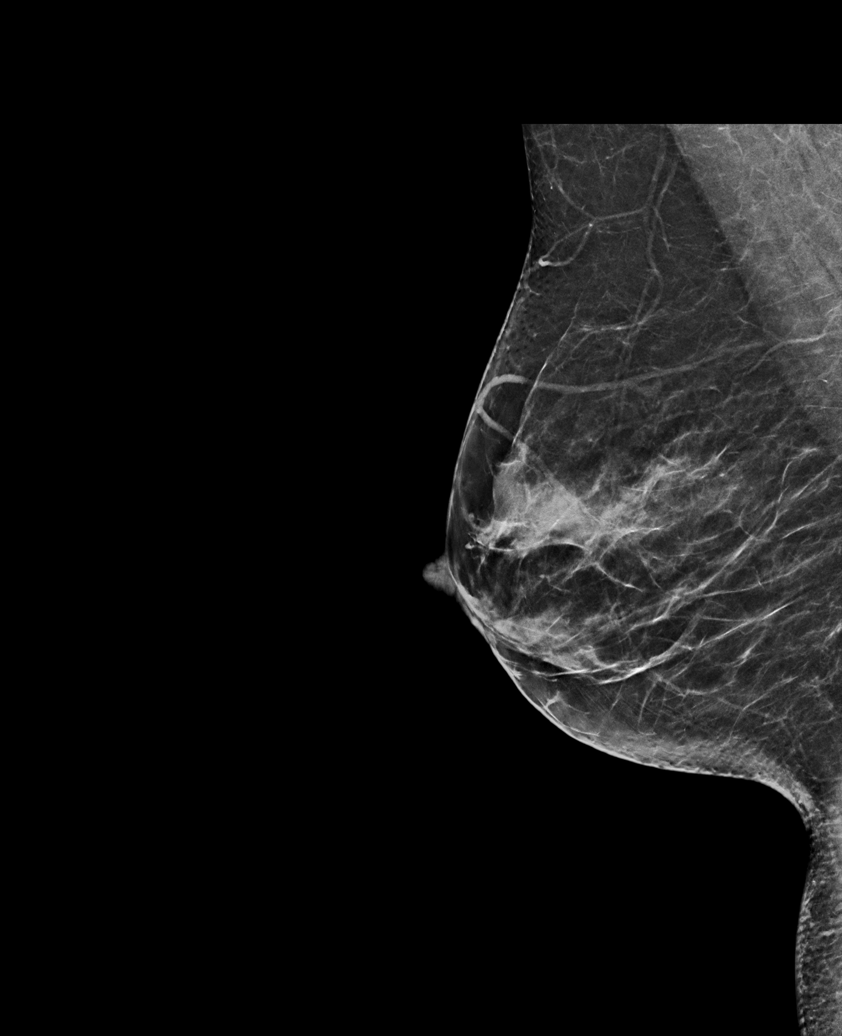

[5 of 30 positions shown; findings below may reference images not displayed]

ACR Breast Density Category b: There are scattered areas of
fibroglandular density.
FINDINGS: Tomosynthesis and synthesized full field CC and MLO views of both
breasts were obtained. Tomosynthesis and synthesized spot
compression tangential view of the area of concern in the LEFT
breast was also obtained.

No mammographic abnormality in the LOWER LEFT breast in the area of
focal pain. A prominent superficial vein and normal focally dense
fibroglandular tissue is present in this location.

Normal scattered fibroglandular tissue in the UPPER LEFT breast and
UPPER OUTER QUADRANT in the area of clinical concern. No findings
suspicious for malignancy in the LEFT breast.

No findings suspicious for malignancy in the RIGHT breast.

Mammographic images were processed with CAD.

On correlative physical exam, there is a palpable 1-2 cm lump in the
LOWER periareolar LEFT breast corresponding to what the patient is
feeling. The patient describes tenderness to palpation at this
location. There is no palpable abnormality in the UPPER breast or
UPPER OUTER QUADRANT.

Targeted LEFT breast ultrasound is performed, showing a
heterogeneous focus composed of isoechoic masses and hypoechoic
tissue at the 5:30 o'clock position approximately 1 cm from nipple,
measuring in total approximately 1.9 cm demonstrating mixed
posterior characteristics. The largest isoechoic mass measures
approximately 0.7 x 0.4 x 0.6 cm. There is hyperemia in this
location on color Doppler evaluation.
IMPRESSION: 1. Likely focal mastitis involving the LOWER LEFT breast without
evidence of a discrete abscess at this time. The differential
diagnosis might include intraductal papillomas or granulomatous
mastitis.
2. No mammographic evidence of malignancy involving the RIGHT
breast.

RECOMMENDATION:
1. I have given the patient a prescription for dicloxacillin 500 mg
every 6 hours for 7 days. She will be scheduled to return for
follow-up ultrasound on [REDACTED], [DATE]. The prescription was
telephoned to the [facility] in [place], [place] [HOSPITAL].
2. If the heterogeneous focus at the 5:30 o'clock position
approximately 1 cm from the nipple is unchanged in appearance at the
time of the follow-up ultrasound, ultrasound-guided core needle
biopsy would be recommended at that time.

I have discussed the findings and recommendations with the patient.
If applicable, a reminder letter will be sent to the patient
regarding the next appointment.

BI-RADS CATEGORY  3: Probably benign.

## 2022-04-07 ENCOUNTER — Other Ambulatory Visit: Payer: Self-pay | Admitting: Psychiatry

## 2022-04-07 DIAGNOSIS — G4701 Insomnia due to medical condition: Secondary | ICD-10-CM

## 2022-04-07 DIAGNOSIS — F3178 Bipolar disorder, in full remission, most recent episode mixed: Secondary | ICD-10-CM

## 2022-04-16 DIAGNOSIS — R7303 Prediabetes: Secondary | ICD-10-CM | POA: Insufficient documentation

## 2022-07-01 ENCOUNTER — Encounter: Payer: Self-pay | Admitting: Psychiatry

## 2022-07-01 ENCOUNTER — Ambulatory Visit: Payer: BC Managed Care – PPO | Admitting: Psychiatry

## 2022-07-01 VITALS — BP 130/86 | HR 87 | Temp 98.0°F | Ht 66.25 in | Wt 192.0 lb

## 2022-07-01 DIAGNOSIS — G4701 Insomnia due to medical condition: Secondary | ICD-10-CM | POA: Diagnosis not present

## 2022-07-01 DIAGNOSIS — F3178 Bipolar disorder, in full remission, most recent episode mixed: Secondary | ICD-10-CM

## 2022-07-01 DIAGNOSIS — F17200 Nicotine dependence, unspecified, uncomplicated: Secondary | ICD-10-CM | POA: Diagnosis not present

## 2022-07-01 NOTE — Progress Notes (Unsigned)
BH MD OP Progress Note  07/01/2022 12:42 PM Lindsay Brown  MRN:  [id number]  Chief Complaint:  Chief Complaint  Patient presents with   Follow-up   Anxiety   Manic Behavior   Depression   HPI: Lindsay Brown is a 36 year old Hispanic female, single, employed, lives in [place], has a history of bipolar disorder, insomnia was evaluated in office today.  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  Patient reports the problem is currently being resolved however for a week when this happened she felt extremely anxious, sad and overwhelmed.  She reports she did not feel like going to work however was able to push herself to do so.  She reports that has resolved and she feels much better now.  She reports she is happy that she was able to take a stand for her students.  She loves her job.  Patient reports she also recently attended a conference about cultural equity, diversity and is currently trying to find herself since she also is in the minority group.  She is listening to a lot of podcast and reading a lot about this.  Patient reports she continues to follow-up with a therapist with grief counseling.  That has been beneficial.  Currently compliant on the Abilify.  Denies side effects.  Denies any manic hypomanic or depression symptoms.  Reports she was diagnosed with prediabetes recently-most recent hemoglobin A1c-5.7.  She is currently trying to eat healthy and lose weight.  He has upcoming appointment in November for a repeat hemoglobin A1c.  Patient denies any suicidality, homicidality or perceptual disturbances.  Patient denies any other concerns today.  Visit Diagnosis:    ICD-10-CM   1. Bipolar disorder, in full remission, most recent episode mixed (HCC)  F31.78     2. Insomnia due to medical  condition  G47.01    mood    3. Tobacco use disorder  F17.200       Past Psychiatric History: Reviewed past psychiatric history from progress note on 01/03/2019  Past Medical History:  Past Medical History:  Diagnosis Date   Arrhythmia    Asthma    Bipolar disorder (HCC)    Depression    GERD (gastroesophageal reflux disease)     Past Surgical History:  Procedure Laterality Date   BREAST BIOPSY Left 04/15/2020   [place] Bx. X-clip, path pending   TONSILLECTOMY  2011    Family Psychiatric History: Reviewed family psychiatric history from progress note on 01/03/2019  Family History:  Family History  Problem Relation Age of Onset   Bipolar disorder Mother    Drug abuse Mother    Alcohol abuse Mother    Obesity Mother    Skin cancer Mother    Heart disease Mother    Ovarian cysts Mother    Prostate cancer Father    Depression Father    Drug abuse Father    Alcohol abuse Father    Thyroid cancer Sister    Depression Sister    Anxiety disorder Sister    Alcohol abuse Brother    Drug abuse Brother    Anxiety disorder Brother    Breast cancer Neg Hx    Ovarian cancer Neg Hx     Social History: Reviewed social history from progress note on 01/03/2019 Social History   Socioeconomic History  Marital status: Single    Spouse name: Not on file   Number of children: 3   Years of education: Not on file   Highest education level: Bachelor's degree (e.g., BA, AB, BS)  Occupational History   Occupation: [employer]    Comment: full time  Tobacco Use   Smoking status: Some Days    Types: Cigarettes   Smokeless tobacco: Never   Tobacco comments:    Smokes 2-3 every 3-4 weeks, only when out with friends.   Vaping Use   Vaping Use: Never used  Substance and Sexual Activity   Alcohol use: Yes    Alcohol/week: 2.0 standard drinks of alcohol    Types: 2 Glasses of wine per week    Comment: not at the moment    Drug use: No   Sexual activity: Not Currently    Birth  control/protection: I.U.D.    Comment: Mirena  Other Topics Concern   Not on file  Social History Narrative   Not on file   Social Determinants of Health   Financial Resource Strain: Low Risk  (08/03/2017)   Overall Financial Resource Strain (CARDIA)    Difficulty of Paying Living Expenses: Not hard at all  Food Insecurity: No Food Insecurity (08/03/2017)   Hunger Vital Sign    Worried About Running Out of Food in the Last Year: Never true    Ran Out of Food in the Last Year: Never true  Transportation Needs: No Transportation Needs (08/03/2017)   PRAPARE - Hydrologist (Medical): No    Lack of Transportation (Non-Medical): No  Physical Activity: Inactive (08/03/2017)   Exercise Vital Sign    Days of Exercise per Week: 0 days    Minutes of Exercise per Session: 0 min  Stress: Stress Concern Present (08/03/2017)   [place]    Feeling of Stress : To some extent  Social Connections: Moderately Isolated (08/03/2017)   Social Connection and Isolation Panel [NHANES]    Frequency of Communication with Friends and Family: Once a week    Frequency of Social Gatherings with Friends and Family: Once a week    Attends Religious Services: Never    Marine scientist or Organizations: No    Attends Music therapist: Never    Marital Status: Married    Allergies: No Known Allergies  Metabolic Disorder Labs: No results found for: "HGBA1C", "MPG" Lab Results  Component Value Date   PROLACTIN 12.5 04/25/2020   Lab Results  Component Value Date   CHOL 147 02/06/2016   TRIG 186 (H) 02/06/2016   HDL 36 (L) 02/06/2016   [place] 74 02/06/2016   Lab Results  Component Value Date   TSH 0.780 02/06/2016    Therapeutic Level Labs: No results found for: "LITHIUM" No results found for: "VALPROATE" No results found for: "CBMZ"  Current Medications: Current Outpatient  Medications  Medication Sig Dispense Refill   ARIPiprazole (ABILIFY) 5 MG tablet Take 1 tablet (5 mg total) by mouth daily. 90 tablet 1   hydrOXYzine (ATARAX) 10 MG tablet Take 1-2 tablets (10-20 mg total) by mouth at bedtime as needed for anxiety. For sleep (Patient not taking: Reported on 07/01/2022) 180 tablet 0   levonorgestrel (MIRENA) 20 MCG/24HR IUD by Intrauterine route once for 1 dose. 1 each 0   traZODone (DESYREL) 50 MG tablet Take 1 tablet (50 mg total) by mouth at bedtime as needed for sleep.  TAKE 1 TABLET BY MOUTH EVERYDAY AT BEDTIME (Patient not taking: Reported on 07/01/2022) 90 tablet 0   No current facility-administered medications for this visit.     Musculoskeletal: Strength & Muscle Tone: within normal limits Gait & Station: normal Patient leans: N/A  Psychiatric Specialty Exam: Review of Systems  Psychiatric/Behavioral: Negative.    All other systems reviewed and are negative.   Blood pressure 130/86, pulse 87, temperature 98 F (36.7 C), height 5' 6.25" (1.683 m), weight 192 lb (87.1 kg), SpO2 99 %.Body mass index is 30.76 kg/m.  General Appearance: Casual  Eye Contact:  Fair  Speech:  Clear and Coherent  Volume:  Normal  Mood:  Euthymic  Affect:  Congruent  Thought Process:  Goal Directed and Descriptions of Associations: Intact  Orientation:  Full (Time, Place, and Person)  Thought Content: Logical   Suicidal Thoughts:  No  Homicidal Thoughts:  No  Memory:  Immediate;   Fair Recent;   Fair Remote;   Fair  Judgement:  Fair  Insight:  Fair  Psychomotor Activity:  Normal  Concentration:  Concentration: Fair and Attention Span: Fair  Recall:  [employer] of Knowledge: Fair  Language: Fair  Akathisia:  No  Handed:  Right  AIMS (if indicated): done  Assets:  Communication Skills Desire for Improvement Housing Social Support Talents/Skills Transportation Vocational/Educational  ADL's:  Intact  Cognition: WNL  Sleep:  Fair   Screenings: AIMS     Flowsheet Row Office Visit from 07/01/2022 in [facility] Office Visit from 03/03/2022 in [facility]  AIMS Total Score 0 0      GAD-7    Flowsheet Row Office Visit from 07/01/2022 in [facility] Office Visit from 03/03/2022 in [facility] Video Visit from 02/20/2021 in [facility]  Total GAD-7 Score 5 2 1       PHQ2-9    Flowsheet Row Office Visit from 07/01/2022 in [facility] Office Visit from 03/03/2022 in [facility] Video Visit from 09/01/2021 in [facility] Video Visit from 02/20/2021 in [facility]  PHQ-2 Total Score 2 0 0 0  PHQ-9 Total Score 5 -- -- --      Flowsheet Row Office Visit from 07/01/2022 in [facility] Video Visit from 02/20/2021 in [facility]  C-SSRS RISK CATEGORY No Risk Error: Q6 is Yes, you must answer 7        Assessment and Plan: Lindsay Brown is a 36 year old Hispanic female, with history of bipolar disorder, insomnia, currently stable on medications.  Plan as noted below.  Plan Bipolar disorder in remission Abilify at reduced dose  5 mg p.o. daily Hydroxyzine 10 mg p.o. 3 times daily as needed  Insomnia-stable Trazodone 50 mg p.o. nightly as needed Continue sleep hygiene techniques  Tobacco use disorder-improving She smokes a cigarette occasionally. Receptive to counseling.  Patient to follow up with primary care provider for repeat hemoglobin A1c.  Patient to continue to follow-up with 30 counseling-for CBT.  Follow-up in clinic in 3 months or sooner if needed.  This note was generated in part or whole with voice recognition software. Voice recognition is usually quite accurate but there are transcription errors that can  and very often do occur. I apologize for any typographical errors that were not detected and corrected.       Delorise Shiner, MD 07/02/2022, 8:15 AM

## 2022-07-07 ENCOUNTER — Other Ambulatory Visit: Payer: Self-pay | Admitting: Psychiatry

## 2022-07-07 DIAGNOSIS — G4701 Insomnia due to medical condition: Secondary | ICD-10-CM

## 2022-07-07 DIAGNOSIS — F3178 Bipolar disorder, in full remission, most recent episode mixed: Secondary | ICD-10-CM

## 2022-10-05 ENCOUNTER — Encounter: Payer: Self-pay | Admitting: Psychiatry

## 2022-10-05 ENCOUNTER — Ambulatory Visit: Payer: BC Managed Care – PPO | Admitting: Psychiatry

## 2022-10-05 VITALS — BP 147/87 | HR 80 | Temp 98.8°F | Ht 66.25 in | Wt 195.8 lb

## 2022-10-05 DIAGNOSIS — G4701 Insomnia due to medical condition: Secondary | ICD-10-CM

## 2022-10-05 DIAGNOSIS — F3178 Bipolar disorder, in full remission, most recent episode mixed: Secondary | ICD-10-CM | POA: Diagnosis not present

## 2022-10-05 DIAGNOSIS — F17200 Nicotine dependence, unspecified, uncomplicated: Secondary | ICD-10-CM | POA: Diagnosis not present

## 2022-10-05 MED ORDER — ARIPIPRAZOLE 5 MG PO TABS: 2.5000 mg | ORAL_TABLET | Freq: Every day | ORAL | 1 refills | Status: DC

## 2022-10-05 NOTE — Progress Notes (Signed)
BH MD OP Progress Note  10/05/2022 9:20 AM Lindsay Brown  MRN:  [id number]  Chief Complaint:  Chief Complaint  Patient presents with   Follow-up   Medication Refill   Anxiety   Depression   HPI: Lindsay Brown is a 37 year old Hispanic female, single, employed, lives in [place], has a history of bipolar disorder, insomnia was evaluated in office today.  Patient today reports she is currently doing well on the reduced dosage of Abilify 5 mg.  She tries to take it regularly.  However there has been days when she has forgotten to take it.  She has not noticed any worsening mood symptoms or manic or hypomanic symptoms since being on the lower dosage.  Continues to have situational stressors mostly work-related.  Also has relationship struggles with her older son, she is trying to work on it.  She does have a therapist with [facility], therapy sessions are going well.  Patient reports her father has moved in with her.  She is trying to support him as best as she can.  He also helps out with the children and that has been beneficial.  Patient reports she sleeps okay at night.  Does not need the trazodone often.  She also has not been using the hydroxyzine as needed much for anxiety.  Denies any suicidality, homicidality or perceptual disturbances.  Had repeat labs including hemoglobin A1c, lipid panel completed, within normal limits.  Currently watching her diet.  Denies any other concerns today.  Visit Diagnosis:    ICD-10-CM   1. Bipolar disorder, in full remission, most recent episode mixed (HCC)  F31.78 ARIPiprazole (ABILIFY) 5 MG tablet   Type I    2. Insomnia due to medical condition  G47.01    mood    3. Tobacco use disorder  F17.200       Past Psychiatric History: Reviewed past psychiatric history from progress note on 01/03/2019.  Past Medical History:  Past Medical History:  Diagnosis Date   Arrhythmia    Asthma    Bipolar disorder (HCC)     Depression    GERD (gastroesophageal reflux disease)     Past Surgical History:  Procedure Laterality Date   BREAST BIOPSY Left 04/15/2020   [place] Bx. X-clip, path pending   TONSILLECTOMY  2011    Family Psychiatric History: Reviewed family psychiatric history from progress note on 01/03/2019.  Family History:  Family History  Problem Relation Age of Onset   Bipolar disorder Mother    Drug abuse Mother    Alcohol abuse Mother    Obesity Mother    Skin cancer Mother    Heart disease Mother    Ovarian cysts Mother    Prostate cancer Father    Depression Father    Drug abuse Father    Alcohol abuse Father    Thyroid cancer Sister    Depression Sister    Anxiety disorder Sister    Alcohol abuse Brother    Drug abuse Brother    Anxiety disorder Brother    Breast cancer Neg Hx    Ovarian cancer Neg Hx     Social History: Reviewed social history from progress note on 01/03/2019. Social History   Socioeconomic History   Marital status: Single    Spouse name: Not on file   Number of children: 3   Years of education: Not on file   Highest education level: Bachelor's degree (e.g., BA, AB, BS)  Occupational History   Occupation: [employer]    Comment: full time  Tobacco Use   Smoking status: Some Days    Types: Cigarettes   Smokeless tobacco: Never   Tobacco comments:    Smokes 2-3 every 3-4 weeks, only when out with friends.   Vaping Use   Vaping Use: Never used  Substance and Sexual Activity   Alcohol use: Yes    Alcohol/week: 2.0 standard drinks of alcohol    Types: 2 Glasses of wine per week    Comment: not at the moment    Drug use: No   Sexual activity: Not Currently    Birth control/protection: I.U.D.    Comment: Mirena  Other Topics Concern   Not on file  Social History Narrative   Not on file   Social Determinants of Health   Financial Resource Strain: Low Risk  (08/03/2017)   Overall Financial Resource Strain (CARDIA)    Difficulty of Paying  Living Expenses: Not hard at all  Food Insecurity: No Food Insecurity (08/03/2017)   Hunger Vital Sign    Worried About Running Out of Food in the Last Year: Never true    Ran Out of Food in the Last Year: Never true  Transportation Needs: No Transportation Needs (08/03/2017)   PRAPARE - Hydrologist (Medical): No    Lack of Transportation (Non-Medical): No  Physical Activity: Inactive (08/03/2017)   Exercise Vital Sign    Days of Exercise per Week: 0 days    Minutes of Exercise per Session: 0 min  Stress: Stress Concern Present (08/03/2017)   [place]    Feeling of Stress : To some extent  Social Connections: Moderately Isolated (08/03/2017)   Social Connection and Isolation Panel [NHANES]    Frequency of Communication with Friends and Family: Once a week    Frequency of Social Gatherings with Friends and Family: Once a week    Attends Religious Services: Never    Marine scientist or Organizations: No    Attends Music therapist: Never    Marital Status: Married    Allergies: No Known Allergies  Metabolic Disorder Labs: No results found for: "HGBA1C", "MPG" Lab Results  Component Value Date   PROLACTIN 12.5 04/25/2020   Lab Results  Component Value Date   CHOL 147 02/06/2016   TRIG 186 (H) 02/06/2016   HDL 36 (L) 02/06/2016   [place] 74 02/06/2016   Lab Results  Component Value Date   TSH 0.780 02/06/2016    Therapeutic Level Labs: No results found for: "LITHIUM" No results found for: "VALPROATE" No results found for: "CBMZ"  Current Medications: Current Outpatient Medications  Medication Sig Dispense Refill   ARIPiprazole (ABILIFY) 5 MG tablet Take 0.5 tablets (2.5 mg total) by mouth daily. 45 tablet 1   levonorgestrel (MIRENA) 20 MCG/24HR IUD by Intrauterine route once for 1 dose. 1 each 0   hydrOXYzine (ATARAX) 10 MG tablet TAKE 1-2 TABLETS  (10-20 MG TOTAL) BY MOUTH AT BEDTIME AS NEEDED FOR ANXIETY. FOR SLEEP (Patient not taking: Reported on 10/05/2022) 180 tablet 0   traZODone (DESYREL) 50 MG tablet Take 1 tablet (50 mg total) by mouth at bedtime as needed. for sleep (Patient not taking: Reported on 10/05/2022) 90 tablet 0   No current facility-administered medications for this visit.     Musculoskeletal: Strength & Muscle Tone: within normal limits Gait & Station: normal Patient leans: N/A  Psychiatric Specialty Exam: Review of  Systems  Psychiatric/Behavioral:  The patient is nervous/anxious.   All other systems reviewed and are negative.   Blood pressure (!) 147/87, pulse 80, temperature 98.8 F (37.1 C), temperature source Oral, height 5' 6.25" (1.683 m), weight 195 lb 12.8 oz (88.8 kg), SpO2 97 %.Body mass index is 31.36 kg/m.  General Appearance: Casual  Eye Contact:  Fair  Speech:  Clear and Coherent  Volume:  Normal  Mood:  Anxious situational  Affect:  Congruent  Thought Process:  Goal Directed and Descriptions of Associations: Intact  Orientation:  Full (Time, Place, and Person)  Thought Content: Logical   Suicidal Thoughts:  No  Homicidal Thoughts:  No  Memory:  Immediate;   Fair Recent;   Fair Remote;   Fair  Judgement:  Fair  Insight:  Fair  Psychomotor Activity:  Normal  Concentration:  Concentration: Fair and Attention Span: Fair  Recall:  [employer] of Knowledge: Fair  Language: Fair  Akathisia:  No  Handed:  Right  AIMS (if indicated): done  Assets:  Communication Skills Desire for Improvement Housing Social Support  ADL's:  Intact  Cognition: WNL  Sleep:  Fair   Screenings: Midwife Visit from 10/05/2022 in [facility] Office Visit from 07/01/2022 in [facility] Office Visit from 03/03/2022 in [facility]  AIMS Total Score 0 0 0      GAD-7     Flowsheet Row Office Visit from 10/05/2022 in [facility] Office Visit from 07/01/2022 in [facility] Office Visit from 03/03/2022 in [facility] Video Visit from 02/20/2021 in [facility]  Total GAD-7 Score 2 5 2 1       PHQ2-9    Flowsheet Row Office Visit from 10/05/2022 in [facility] Office Visit from 07/01/2022 in [facility] Office Visit from 03/03/2022 in [facility] Video Visit from 09/01/2021 in [facility] Video Visit from 02/20/2021 in [facility]  PHQ-2 Total Score 0 2 0 0 0  PHQ-9 Total Score -- 5 -- -- --      Flowsheet Row Office Visit from 10/05/2022 in [facility] Office Visit from 07/01/2022 in [facility] Video Visit from 02/20/2021 in [facility]  C-SSRS RISK CATEGORY No Risk No Risk Error: Q6 is Yes, you must answer 7        Assessment and Plan: Monchel Pollitt is a 37 year old Hispanic female who has a history of bipolar disorder, insomnia was evaluated in office today.  Patient is currently stable.  Plan Bipolar disorder in remission Reduce Abilify to 2.5 mg p.o. daily. Patient advised to monitor her symptoms closely.  Encourage compliance. Hydroxyzine 10 mg p.o. 3 times daily as needed  Insomnia-stable Trazodone 50 mg p.o. nightly as needed Continue sleep hygiene techniques  Tobacco use disorder-improving Provided counseling for 1 minute.  She used cigarettes a few times a week only.  Reviewed labs-hemoglobin A1c down trending-07/24/2022-5.4. Lipid panel-within normal  limits.  Patient with elevated blood pressure reading to follow up with primary care provider.  Recent blood pressure reading-123/79-on 09/16/2022 at [facility] primary medicine.  It was within normal limits.  Reviewed notes per Dr. [facility].  Follow-up in clinic in 3 to 4 months or sooner if needed.   This note was generated in part or whole with voice recognition software. Voice recognition is usually quite accurate but there are transcription errors that can and very often do occur. I apologize for any typographical errors that were not detected and corrected.     Ursula Alert, MD 10/05/2022, 9:20 AM

## 2022-12-14 DIAGNOSIS — S161XXA Strain of muscle, fascia and tendon at neck level, initial encounter: Secondary | ICD-10-CM | POA: Insufficient documentation

## 2022-12-14 DIAGNOSIS — M542 Cervicalgia: Secondary | ICD-10-CM | POA: Insufficient documentation

## 2023-01-01 DIAGNOSIS — E041 Nontoxic single thyroid nodule: Secondary | ICD-10-CM | POA: Insufficient documentation

## 2023-01-11 ENCOUNTER — Ambulatory Visit: Payer: BC Managed Care – PPO | Admitting: Psychiatry

## 2023-01-11 ENCOUNTER — Encounter: Payer: Self-pay | Admitting: Psychiatry

## 2023-01-11 VITALS — BP 126/83 | HR 83 | Temp 97.3°F | Ht 66.25 in | Wt 194.2 lb

## 2023-01-11 DIAGNOSIS — F3178 Bipolar disorder, in full remission, most recent episode mixed: Secondary | ICD-10-CM | POA: Diagnosis not present

## 2023-01-11 DIAGNOSIS — F419 Anxiety disorder, unspecified: Secondary | ICD-10-CM | POA: Diagnosis not present

## 2023-01-11 DIAGNOSIS — G4701 Insomnia due to medical condition: Secondary | ICD-10-CM | POA: Diagnosis not present

## 2023-01-11 NOTE — Progress Notes (Unsigned)
BH MD OP Progress Note  01/11/2023 2:26 PM Lindsay Brown  MRN:  [id number]  Chief Complaint:  Chief Complaint  Patient presents with   Follow-up   Anxiety   Medication Refill   HPI: Lindsay Brown is a 37 year old Hispanic female, single, employed, lives in [place], has a history of bipolar disorder, insomnia was evaluated in office today.  Patient today reports she has been going through some physical problems of heart palpitation recently.  She reports she also had elevated blood pressure readings.  She reports she was started on propranolol by her provider.  She also has upcoming cardiology visit.  Patient reports she was diagnosed with cervical radiculopathy as well as thyroid nodules.  Had ultrasound of her thyroid which indicated right superior thyroid nodule and was recommended 1 year follow-up.  I have reviewed notes per primary provider Lindsay Brown.  Patient reports for her neck pain which was radiating down her shoulder she had physical therapy, was treated with steroids, anti-inflammatory and muscle relaxant.  Patient denies any significant depression symptoms.  Denies any mania or hypomanic symptoms.  Currently compliant on the Abilify.  Reports work as stressful since she keeps getting more and more responsibilities.  Unknown if that triggering her anxiety and her heart palpitations.  Patient denies any suicidality, homicidality or perceptual disturbances.  She does have a psychotherapist whom she follows up with on a monthly basis.  Motivated to have more frequent sessions.  Patient denies any other concerns today.  Visit Diagnosis:    ICD-10-CM   1. Bipolar disorder, in full remission, most recent episode mixed (HCC)  F31.78    Type I    2. Insomnia due to medical condition  G47.01    Mood    3. Anxiety disorder, unspecified type  F41.9       Past Psychiatric History: I have reviewed past psychiatric history from progress note on 01/03/2019.  Past  Medical History:  Past Medical History:  Diagnosis Date   Arrhythmia    Asthma    Bipolar disorder (HCC)    Depression    GERD (gastroesophageal reflux disease)     Past Surgical History:  Procedure Laterality Date   BREAST BIOPSY Left 04/15/2020   [place] Bx. X-clip, path pending   TONSILLECTOMY  2011    Family Psychiatric History: I have reviewed family psychiatric history from progress note on 01/03/2019.  Family History:  Family History  Problem Relation Age of Onset   Bipolar disorder Mother    Drug abuse Mother    Alcohol abuse Mother    Obesity Mother    Skin cancer Mother    Heart disease Mother    Ovarian cysts Mother    Prostate cancer Father    Depression Father    Drug abuse Father    Alcohol abuse Father    Thyroid cancer Sister    Depression Sister    Anxiety disorder Sister    Alcohol abuse Brother    Drug abuse Brother    Anxiety disorder Brother    Breast cancer Neg Hx    Ovarian cancer Neg Hx     Social History: I have reviewed social history from progress note on 01/03/2019. Social History   Socioeconomic History   Marital status: Single    Spouse name: Not on file   Number of children: 3   Years of education: Not on file   Highest education level: Bachelor's degree (e.g., BA, AB, BS)  Occupational History  Occupation: [employer]    Comment: full time  Tobacco Use   Smoking status: Some Days    Types: Cigarettes   Smokeless tobacco: Never   Tobacco comments:    Smokes 2-3 every 3-4 weeks, only when out with friends.   Vaping Use   Vaping Use: Never used  Substance and Sexual Activity   Alcohol use: Yes    Alcohol/week: 2.0 standard drinks of alcohol    Types: 2 Glasses of wine per week    Comment: not at the moment    Drug use: No   Sexual activity: Not Currently    Birth control/protection: I.U.D.    Comment: Mirena  Other Topics Concern   Not on file  Social History Narrative   Not on file   Social Determinants of  Health   Financial Resource Strain: Low Risk  (08/03/2017)   Overall Financial Resource Strain (CARDIA)    Difficulty of Paying Living Expenses: Not hard at all  Food Insecurity: No Food Insecurity (08/03/2017)   Hunger Vital Sign    Worried About Running Out of Food in the Last Year: Never true    Ran Out of Food in the Last Year: Never true  Transportation Needs: No Transportation Needs (08/03/2017)   PRAPARE - Administrator, Civil Service (Medical): No    Lack of Transportation (Non-Medical): No  Physical Activity: Inactive (08/03/2017)   Exercise Vital Sign    Days of Exercise per Week: 0 days    Minutes of Exercise per Session: 0 min  Stress: Stress Concern Present (08/03/2017)   [employer] of Occupational Health - Occupational Stress Questionnaire    Feeling of Stress : To some extent  Social Connections: Moderately Isolated (08/03/2017)   Social Connection and Isolation Panel [NHANES]    Frequency of Communication with Friends and Family: Once a week    Frequency of Social Gatherings with Friends and Family: Once a week    Attends Religious Services: Never    Database administrator or Organizations: No    Attends Engineer, structural: Never    Marital Status: Married    Allergies: No Known Allergies  Metabolic Disorder Labs: No results found for: "HGBA1C", "MPG" Lab Results  Component Value Date   PROLACTIN 12.5 04/25/2020   Lab Results  Component Value Date   CHOL 147 02/06/2016   TRIG 186 (H) 02/06/2016   HDL 36 (L) 02/06/2016   LDLCALC 74 02/06/2016   Lab Results  Component Value Date   TSH 0.780 02/06/2016    Therapeutic Level Labs: No results found for: "LITHIUM" No results found for: "VALPROATE" No results found for: "CBMZ"  Current Medications: Current Outpatient Medications  Medication Sig Dispense Refill   ARIPiprazole (ABILIFY) 5 MG tablet Take 0.5 tablets (2.5 mg total) by mouth daily. 45 tablet 1   ipratropium  (ATROVENT) 0.06 % nasal spray Place into both nostrils.     propranolol (INDERAL) 10 MG tablet Take 10 mg by mouth 2 (two) times daily.     levonorgestrel (MIRENA) 20 MCG/24HR IUD by Intrauterine route once for 1 dose. 1 each 0   traZODone (DESYREL) 50 MG tablet Take 1 tablet (50 mg total) by mouth at bedtime as needed. for sleep (Patient not taking: Reported on 10/05/2022) 90 tablet 0   No current facility-administered medications for this visit.     Musculoskeletal: Strength & Muscle Tone: within normal limits Gait & Station: normal Patient leans: N/A  Psychiatric Specialty Exam:  Review of Systems  Cardiovascular:  Positive for palpitations.  Psychiatric/Behavioral:  The patient is nervous/anxious.   All other systems reviewed and are negative.   Blood pressure 126/83, pulse 83, temperature (!) 97.3 F (36.3 C), temperature source Skin, height 5' 6.25" (1.683 m), weight 194 lb 3.2 oz (88.1 kg).Body mass index is 31.11 kg/m.  General Appearance: Casual  Eye Contact:  Fair  Speech:  Clear and Coherent  Volume:  Normal  Mood:  Anxious  Affect:  Appropriate  Thought Process:  Goal Directed and Descriptions of Associations: Intact  Orientation:  Full (Time, Place, and Person)  Thought Content: Logical   Suicidal Thoughts:  No  Homicidal Thoughts:  No  Memory:  Immediate;   Fair Recent;   Fair Remote;   Fair  Judgement:  Fair  Insight:  Fair  Psychomotor Activity:  Normal  Concentration:  Concentration: Fair and Attention Span: Fair  Recall:  [employer] of Knowledge: Fair  Language: Fair  Akathisia:  No  Handed:  Right  AIMS (if indicated):  done  Assets:  Communication Skills Desire for Improvement Housing Social Support  ADL's:  Intact  Cognition: WNL  Sleep:  Fair   Screenings: Midwife Visit from 01/11/2023 in [facility] Office Visit from 10/05/2022 in [facility] Office Visit from 07/01/2022 in [facility] Office Visit from 03/03/2022 in [facility]  AIMS Total Score 0 0 0 0      GAD-7    Flowsheet Row Office Visit from 01/11/2023 in [facility] Office Visit from 10/05/2022 in [facility] Office Visit from 07/01/2022 in [facility] Office Visit from 03/03/2022 in [facility] Video Visit from 02/20/2021 in [facility]  Total GAD-7 Score 4 2 5 2 1       PHQ2-9    Flowsheet Row Office Visit from 01/11/2023 in [place] [facility] Office Visit from 10/05/2022 in [facility] Office Visit from 07/01/2022 in [facility] Office Visit from 03/03/2022 in [facility] Video Visit from 09/01/2021 in [facility]  PHQ-2 Total Score 1 0 2 0 0  PHQ-9 Total Score 3 -- 5 -- --      Flowsheet Row Office Visit from 01/11/2023 in [facility] Office Visit from 10/05/2022 in [facility] Office Visit from 07/01/2022 in [facility]  C-SSRS RISK CATEGORY No Risk No Risk No Risk        Assessment and Plan: Lindsay Brown is a 37 year old Hispanic female who has a history of bipolar disorder, insomnia was evaluated in office today.  Patient is currently struggling with possible anxiety, recent heart palpitation and elevated blood pressure reading, currently on propranolol, has upcoming appointment with cardiology.  Patient will benefit from the following plan.  Plan Bipolar  disorder in remission Abilify 2.5 mg p.o. daily-reduced dosage Hydroxyzine 10 mg p.o. 3 times daily as needed  Insomnia-stable Trazodone 50 mg p.o. nightly as needed Continue sleep hygiene techniques  Anxiety unspecified-unstable Patient to have more frequent psychotherapy sessions with her therapist. Once cardiology clears her, will consider starting an SSRI or SNRI if she continues  to have anxiety symptoms. Will coordinate care with cardiology.  Follow-up in clinic in 2 months or sooner if needed.    Collaboration of Care: Collaboration of Care: Other patient to sign an ROI to obtain records from cardiology.  Patient/Guardian was advised Release of Information must be obtained prior to any record release in order to collaborate their care with an outside provider. Patient/Guardian was advised if they have not already done so to contact the registration department to sign all necessary forms in order for [place] to release information regarding their care.   Consent: Patient/Guardian gives verbal consent for treatment and assignment of benefits for services provided during this visit. Patient/Guardian expressed understanding and agreed to proceed.   This note was generated in part or whole with voice recognition software. Voice recognition is usually quite accurate but there are transcription errors that can and very often do occur. I apologize for any typographical errors that were not detected and corrected.    Jomarie Longs, MD 01/12/2023, 9:17 AM

## 2023-01-11 NOTE — Patient Instructions (Signed)
Escitalopram Tablets What is this medication? ESCITALOPRAM (es sye TAL oh pram) treats depression and anxiety. It increases the amount of serotonin in the brain, a hormone that helps regulate mood. It belongs to a group of medications called SSRIs. This medicine may be used for other purposes; ask your health care provider or pharmacist if you have questions. COMMON BRAND NAME(S): Lexapro What should I tell my care team before I take this medication? They need to know if you have any of these conditions: Bipolar disorder or a family history of bipolar disorder Diabetes Glaucoma Heart disease Kidney disease Liver disease Receiving electroconvulsive therapy Seizures Suicidal thoughts, plans, or attempt by you or a family member An unusual or allergic reaction to escitalopram, other medications, foods, dyes, or preservatives Pregnant or trying to become pregnant Breastfeeding How should I use this medication? Take this medication by mouth with a glass of water. Follow the directions on the prescription label. You can take it with or without food. If it upsets your stomach, take it with food. Take your medication at regular intervals. Do not take it more often than directed. Do not stop taking this medication suddenly except upon the advice of your care team. Stopping this medication too quickly may cause serious side effects or your condition may worsen. A special MedGuide will be given to you by the pharmacist with each prescription and refill. Be sure to read this information carefully each time. Talk to your care team regarding the use of this medication in children. Special care may be needed. Overdosage: If you think you have taken too much of this medicine contact a poison control center or emergency room at once. NOTE: This medicine is only for you. Do not share this medicine with others. What if I miss a dose? If you miss a dose, take it as soon as you can. If it is almost time for  your next dose, take only that dose. Do not take double or extra doses. What may interact with this medication? Do not take this medication with any of the following: Certain medications for fungal infections like fluconazole, itraconazole, ketoconazole, posaconazole, voriconazole Cisapride Citalopram Dronedarone Linezolid MAOIs like Carbex, Eldepryl, Marplan, Nardil, and Parnate Methylene blue (injected into a vein) Pimozide Thioridazine This medication may also interact with the following: Alcohol Amphetamines Aspirin and aspirin-like medications Carbamazepine Certain medications for depression, anxiety, or psychotic disturbances Certain medications for migraine headache like almotriptan, eletriptan, frovatriptan, naratriptan, rizatriptan, sumatriptan, zolmitriptan Certain medications for sleep Certain medications that treat or prevent blood clots like warfarin, enoxaparin, dalteparin Cimetidine Diuretics Dofetilide Fentanyl Furazolidone Isoniazid Lithium Metoprolol NSAIDs, medications for pain and inflammation, like ibuprofen or naproxen Other medications that prolong the QT interval (cause an abnormal heart rhythm) Procarbazine Rasagiline Supplements like St. John's wort, kava kava, valerian Tramadol Tryptophan Ziprasidone This list may not describe all possible interactions. Give your health care provider a list of all the medicines, herbs, non-prescription drugs, or dietary supplements you use. Also tell them if you smoke, drink alcohol, or use illegal drugs. Some items may interact with your medicine. What should I watch for while using this medication? Tell your care team if your symptoms do not get better or if they get worse. Visit your care team for regular checks on your progress. Because it may take several weeks to see the full effects of this medication, it is important to continue your treatment as prescribed by your care team. Watch for new or worsening  thoughts of suicide or   depression. This includes sudden changes in mood, behaviors, or thoughts. These changes can happen at any time but are more common in the beginning of treatment or after a change in dose. Call your care team right away if you experience these thoughts or worsening depression. This medication may cause mood and behavior changes, such as anxiety, nervousness, irritability, hostility, restlessness, excitability, hyperactivity, or trouble sleeping. These changes can happen at any time but are more common in the beginning of treatment or after a change in dose. Call your care team right away if you notice any of these symptoms. This medication may affect your coordination, reaction time, or judgment. Do not drive or operate machinery until you know how this medication affects you. Sit up or stand slowly to reduce the risk of dizzy or fainting spells. Drinking alcohol with this medication can increase the risk of these side effects. Your mouth may get dry. Chewing sugarless gum or sucking hard candy and drinking plenty of water may help. Contact your care team if the problem does not go away or is severe. What side effects may I notice from receiving this medication? Side effects that you should report to your care team as soon as possible: Allergic reactions--skin rash, itching, hives, swelling of the face, lips, tongue, or throat Bleeding--bloody or black, tar-like stools, red or dark brown urine, vomiting blood or brown material that looks like coffee grounds, small, red or purple spots on skin, unusual bleeding or bruising Heart rhythm changes--fast or irregular heartbeat, dizziness, feeling faint or lightheaded, chest pain, trouble breathing Low sodium level--muscle weakness, fatigue, dizziness, headache, confusion Serotonin syndrome--irritability, confusion, fast or irregular heartbeat, muscle stiffness, twitching muscles, sweating, high fever, seizure, chills, vomiting,  diarrhea Sudden eye pain or change in vision such as blurry vision, seeing halos around lights, vision loss Thoughts of suicide or self-harm, worsening mood, feelings of depression Side effects that usually do not require medical attention (report to your care team if they continue or are bothersome): Change in sex drive or performance Diarrhea Excessive sweating Nausea Tremors or shaking Upset stomach This list may not describe all possible side effects. Call your doctor for medical advice about side effects. You may report side effects to FDA at 1-800-FDA-1088. Where should I keep my medication? Keep out of reach of children and pets. Store at room temperature between 15 and 30 degrees C (59 and 86 degrees F). Throw away any unused medication after the expiration date. NOTE: This sheet is a summary. It may not cover all possible information. If you have questions about this medicine, talk to your doctor, pharmacist, or health care provider.  2023 Elsevier/Gold Standard (2020-08-19 00:00:00)  

## 2023-03-21 ENCOUNTER — Emergency Department
Admission: EM | Admit: 2023-03-21 | Discharge: 2023-03-21 | Disposition: A | Discharge status: Home / Self Care | Payer: BC Managed Care – PPO | Attending: Emergency Medicine | Admitting: Emergency Medicine

## 2023-03-21 ENCOUNTER — Encounter: Payer: Self-pay | Admitting: Emergency Medicine

## 2023-03-21 ENCOUNTER — Emergency Department: Payer: BC Managed Care – PPO

## 2023-03-21 ENCOUNTER — Other Ambulatory Visit: Payer: Self-pay

## 2023-03-21 DIAGNOSIS — N760 Acute vaginitis: Secondary | ICD-10-CM | POA: Insufficient documentation

## 2023-03-21 DIAGNOSIS — R102 Pelvic and perineal pain: Secondary | ICD-10-CM

## 2023-03-21 DIAGNOSIS — B9689 Other specified bacterial agents as the cause of diseases classified elsewhere: Secondary | ICD-10-CM

## 2023-03-21 LAB — URINALYSIS, ROUTINE W REFLEX MICROSCOPIC
Bilirubin Urine: NEGATIVE
Glucose, UA: NEGATIVE mg/dL
Hgb urine dipstick: NEGATIVE
Ketones, ur: NEGATIVE mg/dL
Leukocytes,Ua: NEGATIVE
Nitrite: NEGATIVE
Protein, ur: NEGATIVE mg/dL
Specific Gravity, Urine: 1.02 (ref 1.005–1.030)
pH: 5 (ref 5.0–8.0)

## 2023-03-21 LAB — POC URINE PREG, ED: Preg Test, Ur: NEGATIVE

## 2023-03-21 LAB — WET PREP, GENITAL
Sperm: NONE SEEN
Trich, Wet Prep: NONE SEEN
WBC, Wet Prep HPF POC: <10 (ref <10)
Yeast Wet Prep HPF POC: NONE SEEN

## 2023-03-21 LAB — CHLAMYDIA/NGC RT PCR (ARMC ONLY)
Chlamydia Tr: NOT DETECTED
N gonorrhoeae: NOT DETECTED

## 2023-03-21 MED ORDER — METRONIDAZOLE 0.75 % VA GEL: 1.0000 | Freq: Every day | VAGINAL | 0 refills | Status: AC

## 2023-03-21 NOTE — ED Triage Notes (Signed)
Pt presents ambulatory to triage via POV with complaints of pelvic pain x 2 hours after urinating. Pt believes her IUD has become dislodged due to the pain feeling the same as it was put in 4-5 years ago. Pt took tylenol PTA. A&Ox4 at this time. Denies CP or SOB.

## 2023-03-21 NOTE — Discharge Instructions (Addendum)
You were found to have bacterial vaginosis which is not a sexually transmitted disease.  Use the applicators as we discussed.  Please return for any new, worsening, or change in symptoms or other concerns.  It was a pleasure caring for you today.

## 2023-03-21 NOTE — ED Provider Notes (Signed)
[facility] Provider Note    Event Date/Time   First MD Initiated Contact with Patient 03/21/23 [id number]     (approximate)   History   Pelvic Pain   HPI  Lindsay Brown is a 37 y.o. female with a past medical history of anxiety, depression who presents today for pelvic pain.  Patient reports that this began this morning and she has also noticed her pain this morning was described as she reports that her pain has since resolved completely.  She did not have any vomiting or diarrhea.  She does not have dyspareunia.  She is sexually active with 1 female partner.  Currently is pain-free.  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 --      Excl. in GC? --     Most recent vital signs: Vitals:   03/21/23 0916 03/21/23 0916  BP: 120/64    Pulse: 80   Resp: 18   Temp:  98 F (36.7 C)  SpO2: 99%     Physical Exam Vitals and nursing note reviewed.  Constitutional:      General: Awake and alert. No acute distress.    Appearance: Normal appearance. The patient is overweight.  HENT:     Head: Normocephalic and atraumatic.     Mouth: Mucous membranes are moist.  Eyes:     General: PERRL. Normal EOMs        Right eye: No discharge.        Left eye: No discharge.     Conjunctiva/sclera: Conjunctivae normal.  Cardiovascular:     Rate and Rhythm: Normal rate and regular rhythm.     Pulses: Normal pulses.  Pulmonary:     Effort: Pulmonary effort is normal. No respiratory distress.     Breath sounds: Normal breath sounds.  Abdominal:     Abdomen is soft. There is no abdominal tenderness. No rebound or guarding. No distention. Musculoskeletal:        General: No swelling. Normal range of motion.     Cervical back:  Normal range of motion and neck supple.  Skin:    General: Skin is warm and dry.     Capillary Refill: Capillary refill takes less than 2 seconds.     Findings: No rash.  Neurological:     Mental Status: The patient is awake and alert.      ED Results / Procedures / Treatments   Labs (all labs ordered are listed, but only abnormal results are displayed) Labs Reviewed  WET PREP, GENITAL - Abnormal; Notable for the following components:      Result Value   Clue Cells Wet Prep HPF POC PRESENT (*)    All other components within normal limits  URINALYSIS, ROUTINE W REFLEX MICROSCOPIC - Abnormal; Notable for the following components:   Color, Urine YELLOW (*)    APPearance CLEAR (*)    All other components within normal limits  CHLAMYDIA/NGC RT PCR ([facility] ONLY)            POC URINE PREG, ED     EKG     RADIOLOGY I independently reviewed and interpreted imaging and agree with radiologists findings.     PROCEDURES:  Critical Care performed:   Procedures   MEDICATIONS ORDERED IN  ED: Medications - No data to display   IMPRESSION / MDM / ASSESSMENT AND PLAN / ED COURSE  I reviewed the triage vital signs and the nursing notes.   Differential diagnosis includes, but is not limited to, UTI, pregnancy, ectopic pregnancy, BV, STD.  Patient is awake and alert, hemodynamically stable and afebrile.  She is resting comfortably in her stretcher.  She has no reproducible abdominal tenderness or flank pain.  Urinalysis obtained in triage is negative for evidence of infection.  Ultrasound also obtained in triage demonstrates no acute abnormalities, normal appearance and location of IUD.  She agreed to swabs given her reported vaginal discharge.  She declined pelvic exam.  She has no dyspareunia, low concern at this time for PID.  Wet prep is positive for BV.  GC/chlamydia negative. She was given the option of intravaginal metronidazole versus oral metronidazole and she prefers intravaginal applicators.  She declined any blood testing including HIV testing.  We discussed return precautions and outpatient follow-up.  Patient understands and agrees with plan.  She was discharged in stable condition.   Patient's presentation is most consistent with acute complicated illness / injury requiring diagnostic workup.    FINAL CLINICAL IMPRESSION(S) / ED DIAGNOSES   Final diagnoses:  Pelvic pain in female  Bacterial vaginosis     Rx / DC Orders   ED Discharge Orders          Ordered    metroNIDAZOLE (METROGEL) 0.75 % vaginal gel  Daily at bedtime        03/21/23 0804             Note:  This document was prepared using Dragon voice recognition software and may include unintentional dictation errors.   Jackelyn Hoehn, PA-C 03/21/23 1302    Phineas Semen, MD 03/21/23 1324

## 2023-03-22 ENCOUNTER — Ambulatory Visit: Payer: BC Managed Care – PPO | Admitting: Obstetrics and Gynecology

## 2023-03-22 ENCOUNTER — Other Ambulatory Visit (HOSPITAL_COMMUNITY)
Admission: RE | Admit: 2023-03-22 | Discharge: 2023-03-22 | Disposition: A | Discharge status: Home / Self Care | Payer: BC Managed Care – PPO | Source: Ambulatory Visit | Attending: Obstetrics and Gynecology | Admitting: Obstetrics and Gynecology

## 2023-03-22 ENCOUNTER — Encounter: Payer: Self-pay | Admitting: Obstetrics and Gynecology

## 2023-03-22 VITALS — BP 122/70 | Ht 66.75 in | Wt 200.0 lb

## 2023-03-22 DIAGNOSIS — Z124 Encounter for screening for malignant neoplasm of cervix: Secondary | ICD-10-CM

## 2023-03-22 DIAGNOSIS — Z1151 Encounter for screening for human papillomavirus (HPV): Secondary | ICD-10-CM | POA: Diagnosis present

## 2023-03-22 DIAGNOSIS — R350 Frequency of micturition: Secondary | ICD-10-CM | POA: Diagnosis not present

## 2023-03-22 DIAGNOSIS — R1032 Left lower quadrant pain: Secondary | ICD-10-CM

## 2023-03-22 LAB — POCT URINALYSIS DIPSTICK
Bilirubin, UA: NEGATIVE
Blood, UA: NEGATIVE
Glucose, UA: NEGATIVE
Ketones, UA: NEGATIVE
Leukocytes, UA: NEGATIVE
Nitrite, UA: NEGATIVE
Protein, UA: NEGATIVE
Spec Grav, UA: 1.02 (ref 1.010–1.025)
pH, UA: 5 (ref 5.0–8.0)

## 2023-03-22 NOTE — Progress Notes (Signed)
Lindsay Man, PA-C   Chief Complaint  Patient presents with   Follow-up    ER. Pt woke up with severe left side pelvic pain yesterday around 4:30 am. The pain today is 1-2. Feels very bloated.    HPI:      [name] is a 37 y.o. [phone] whose LMP was No LMP recorded. (Menstrual status: IUD)., presents today for NP > 3 yrs ER f/u for LLQ pain yesterday. Awoke yesterday with severe cramping pain LLQ and LT uterine area with nausea and diarrhea. Took tylenol and went to ED due to sx and hx of having IUD. Sx were "a 35" at first, then decreased to 1-2/10 in ED. GYN u/s was WNL except small amt FF in CDS; neg STD testing, neg UPT, neg UA. Did have clue cells for BV and pt started on metrogel. Denies any vag sx although has had BV in past. Had watery diarrhea yesterday that has persisted today. No fevers but has felt hot and clammy since yesterday. Pain now is still 1-2/10 and comes and goes; also feels bloated and has pelvic pressure. Still light cramping. No LBP. Has had urinary frequency with good flow, no dysuria/hematuria. She is sexually active, no pain/bleeding.  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 X-clip, path pending   TONSILLECTOMY  2011    Family History  Problem Relation Age of Onset   Bipolar disorder Mother    Drug abuse Mother    Alcohol abuse Mother    Obesity Mother    Skin cancer Mother    Heart disease Mother    Ovarian cysts Mother    Prostate cancer Father    Depression Father    Drug abuse Father    Alcohol abuse Father    Thyroid cancer Sister    Depression Sister    Anxiety disorder Sister    Alcohol abuse Brother    Drug abuse Brother    Anxiety disorder Brother    Breast cancer Neg Hx    Ovarian cancer Neg Hx     Social History   Socioeconomic History   Marital status: Single    Spouse name: Not on file   Number of children: 3   Years of education: Not on file   Highest education level: Bachelor's degree (e.g., BA, AB, BS)  Occupational History   Occupation: [employer]    Comment: full time  Tobacco Use   Smoking status: Some Days    Types: Cigarettes   Smokeless tobacco: Never   Tobacco comments:    Smokes 2-3 every 3-4 weeks, only when out with friends.   Vaping Use   Vaping Use: Never  used  Substance and Sexual Activity   Alcohol use: Yes    Alcohol/week: 2.0 standard drinks of alcohol    Types: 2 Glasses of wine per week    Comment: not at the moment    Drug use: No   Sexual activity: Yes    Birth control/protection: I.U.D.    Comment: Mirena  Other Topics Concern   Not on file  Social History Narrative   Not on file   Social Determinants of Health   Financial Resource Strain: Low Risk  (08/03/2017)   Overall Financial Resource Strain (CARDIA)    Difficulty of Paying Living Expenses: Not hard at all  Food Insecurity: No Food Insecurity (08/03/2017)   Hunger Vital Sign    Worried About Running Out of Food in the Last Year: Never true    Ran Out of Food in the Last Year: Never true  Transportation Needs: No Transportation Needs (08/03/2017)   PRAPARE - Scientist, research (physical sciences) (Medical): No    Lack of Transportation (Non-Medical): No  Physical Activity: Inactive (08/03/2017)   Exercise Vital Sign    Days of Exercise per Week: 0 days    Minutes of Exercise per Session: 0 min  Stress: Stress Concern Present (08/03/2017)   [employer] of Occupational Health - Occupational Stress Questionnaire    Feeling of Stress : To some extent  Social Connections: Moderately Isolated (08/03/2017)   Social Connection and Isolation Panel [NHANES]    Frequency of Communication with Friends and Family: Once a week    Frequency of Social Gatherings with Friends and Family: Once a week    Attends Religious Services: Never    Database administrator or Organizations: No    Attends Banker Meetings: Never    Marital Status: Married  Catering manager Violence: Not At Risk (08/03/2017)   Humiliation, Afraid, Rape, and Kick questionnaire    Fear of Current or Ex-Partner: No    Emotionally Abused: No    Physically Abused: No    Sexually Abused: No    Outpatient Medications Prior to Visit  Medication Sig Dispense Refill   ARIPiprazole (ABILIFY) 5 MG tablet Take 0.5 tablets (2.5 mg total) by mouth daily. 45 tablet 1   ipratropium (ATROVENT) 0.06 % nasal spray Place into both nostrils.     metroNIDAZOLE (METROGEL) 0.75 % vaginal gel Place 1 Applicatorful vaginally at bedtime for 5 days. 50 g 0   propranolol (INDERAL) 10 MG tablet Take 10 mg by mouth 2 (two) times daily.     levonorgestrel (MIRENA) 20 MCG/24HR IUD by Intrauterine route once for 1 dose. 1 each 0   traZODone (DESYREL) 50 MG tablet Take 1 tablet (50 mg total) by mouth at bedtime as needed. for sleep (Patient not taking: Reported on 10/05/2022) 90 tablet 0   No facility-administered medications prior to visit.      ROS:  Review of Systems  Constitutional:  Negative for fever.  Gastrointestinal:  Positive for diarrhea. Negative for blood in stool, constipation, nausea and vomiting.   Genitourinary:  Positive for frequency and pelvic pain. Negative for dyspareunia, dysuria, flank pain, hematuria, urgency, vaginal bleeding, vaginal discharge and vaginal pain.  Musculoskeletal:  Negative for back pain.  Skin:  Negative for rash.   BREAST: No symptoms   OBJECTIVE:   Vitals:  BP 122/70   Ht 5' 6.75" (1.695 m)   Wt 200 lb (90.7 kg)   BMI 31.56 kg/m   Physical Exam Vitals  reviewed.  Constitutional:      Appearance: She is well-developed.  Pulmonary:     Effort: Pulmonary effort is normal.  Abdominal:     Tenderness: There is abdominal tenderness in the left lower quadrant. There is no guarding or rebound.  Genitourinary:    General: Normal vulva.     Pubic Area: No rash.      Labia:        Right: No rash, tenderness or lesion.        Left: No rash, tenderness or lesion.      Vagina: Normal. No vaginal discharge, erythema or tenderness.     Cervix: No cervical motion tenderness.     Uterus: Normal. Not enlarged and not tender.      Adnexa: Right adnexa normal.       Right: No mass or tenderness.         Left: Tenderness present. No mass.       Comments: IUD STRINGS IN CX OS Musculoskeletal:        General: Normal range of motion.     Cervical back: Normal range of motion.  Skin:    General: Skin is warm and dry.  Neurological:     General: No focal deficit present.     Mental Status: She is alert and oriented to person, place, and time.  Psychiatric:        Mood and Affect: Mood normal.        Behavior: Behavior normal.        Thought Content: Thought content normal.        Judgment: Judgment normal.     Results: Results for orders placed or performed in visit on 03/22/23 (from the past 24 hour(s))  POCT Urinalysis Dipstick     Status: Abnormal   Collection Time: 03/22/23  3:15 PM  Result Value Ref Range   Color, UA yellow    Clarity, UA clear    Glucose, UA Negative Negative   Bilirubin, UA neg    Ketones, UA neg    Spec Grav, UA 1.020  1.010 - 1.025   Blood, UA neg    pH, UA 5.0 5.0 - 8.0   Protein, UA Negative Negative   Urobilinogen, UA     Nitrite, UA neg    Leukocytes, UA Negative Negative   Appearance     Odor       Assessment/Plan: LLQ pain--sx started yesterday, improved after a couple hrs, still improved today. Also with watery stools. Neg UA today, neg eval in ED. Discussed etiology of pelvic pain. Could have been ruptured ovar cyst given sx/hx/FF in CDS. If so, will cont to improve, can do tylenol/ibup/heating pad prn. Could also be GI due to watery diarrhea. Try immodium/BRAT diet. F/u with PCP prn.   Urinary frequency - Plan: POCT Urinalysis Dipstick; neg UA, with good flow. F/u prn.   Cervical cancer screening - Plan: Cytology - PAP  Screening for HPV (human papillomavirus) - Plan: Cytology - PAP   Return if symptoms worsen or fail to improve.  Erminio Nygard B. Debbie Bellucci, PA-C 03/22/2023 3:16 PM

## 2023-03-22 NOTE — Patient Instructions (Signed)
I value your feedback and you entrusting us with your care. If you get a [facility] patient survey, I would appreciate you taking the time to let us know about your experience today. Thank you! ? ? ?

## 2023-03-25 LAB — CYTOLOGY - PAP
Comment: NEGATIVE
Diagnosis: NEGATIVE
High risk HPV: NEGATIVE

## 2023-04-10 ENCOUNTER — Other Ambulatory Visit: Payer: Self-pay | Admitting: Psychiatry

## 2023-04-10 DIAGNOSIS — F3178 Bipolar disorder, in full remission, most recent episode mixed: Secondary | ICD-10-CM

## 2023-04-13 ENCOUNTER — Ambulatory Visit: Payer: BC Managed Care – PPO | Admitting: Psychiatry

## 2023-05-05 ENCOUNTER — Ambulatory Visit: Payer: BC Managed Care – PPO | Admitting: Psychiatry

## 2023-05-06 ENCOUNTER — Telehealth: Payer: Self-pay

## 2023-05-06 NOTE — Telephone Encounter (Signed)
Pt calling; is have the same sxs as she did a month ago when ovarian cyst ruptured - sweating, nausea, freq urination, feels like she needs to poop but can't, cramping really bad, feels like IUD is dislodged; be seen or self medicate?  [phone]

## 2023-05-06 NOTE — Telephone Encounter (Signed)
Pt aware.

## 2023-06-03 ENCOUNTER — Encounter: Payer: Self-pay | Admitting: Psychiatry

## 2023-06-03 ENCOUNTER — Ambulatory Visit: Payer: BC Managed Care – PPO | Admitting: Psychiatry

## 2023-06-03 VITALS — BP 138/88 | HR 80 | Temp 96.9°F | Ht 66.75 in | Wt 207.2 lb

## 2023-06-03 DIAGNOSIS — F3178 Bipolar disorder, in full remission, most recent episode mixed: Secondary | ICD-10-CM | POA: Diagnosis not present

## 2023-06-03 DIAGNOSIS — G4701 Insomnia due to medical condition: Secondary | ICD-10-CM

## 2023-06-03 MED ORDER — ARIPIPRAZOLE 2 MG PO TABS
1.0000 mg | ORAL_TABLET | Freq: Every day | ORAL | 1 refills | Status: DC
Start: 2023-06-03 — End: 2023-12-09

## 2023-06-03 NOTE — Progress Notes (Signed)
BH MD OP Progress Note  06/03/2023 5:15 PM Lindsay Brown  MRN:  [id number]  Chief Complaint:  Chief Complaint  Patient presents with   Follow-up   Anxiety   Depression   Medication Refill   HPI: Lindsay Brown is a 37 year old Hispanic female, single, employed, lives in [place], has a history of bipolar disorder, insomnia was evaluated in office today.  Patient today reports she is currently doing fairly well with regards to her mood.  She reports she was able to attend a fellowship and completed a project.  She reports  she has had multiple art projects completed otherwise recently as well.  She reports she has potential buyers for her projects and she also won a recent award for her art in [place].  Patient reports she hence feels validated.  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  She does not feel appreciated.  She is doing her research regarding what she can do.  Patient reports she has not had any significant mood swings, sadness.  She does have situational anxiety however coping well.  Patient reports sleep is overall good.  She is currently compliant on the Abilify 2.5 mg.  It is working well for her.  She is interested in further tapering of the Abilify.  She denies any suicidality, homicidality or perceptual disturbances.  Patient completed cardiology consultation April 27, 2023 -she is cleared per cardiology.  Patient denies any other concerns today.  Visit Diagnosis:    ICD-10-CM   1. Bipolar disorder, in full remission, most recent episode mixed (HCC)  F31.78 ARIPiprazole (ABILIFY) 2 MG tablet   Type I    2. Insomnia due to medical condition  G47.01    Mood      Past Psychiatric History: I have reviewed past psychiatric history from progress note on 01/03/2019.  Past Medical History:  Past Medical History:  Diagnosis  Date   Arrhythmia    Asthma    Bipolar disorder (HCC)    Depression    GERD (gastroesophageal reflux disease)     Past Surgical History:  Procedure Laterality Date   BREAST BIOPSY Left 04/15/2020   [place] Bx. X-clip, path pending   TONSILLECTOMY  2011    Family Psychiatric History: I have reviewed family psychiatric history from progress note on 01/03/2019.  Family History:  Family History  Problem Relation Age of Onset   Bipolar disorder Mother    Drug abuse Mother    Alcohol abuse Mother    Obesity Mother    Skin cancer Mother    Heart disease Mother    Ovarian cysts Mother    Prostate cancer Father    Depression Father    Drug abuse Father    Alcohol abuse Father    Thyroid cancer Sister    Depression Sister    Anxiety disorder Sister    Alcohol abuse Brother    Drug abuse Brother    Anxiety disorder Brother    Breast cancer Neg Hx    Ovarian cancer Neg Hx     Social History: I have reviewed social history from progress note on 01/03/2019. Social History   Socioeconomic History   Marital status: Single    Spouse name: Not on file   Number of children: 3   Years of education: Not on file   Highest education level: Bachelor's degree (  e.g., BA, AB, BS)  Occupational History   Occupation: [employer]    Comment: full time  Tobacco Use   Smoking status: Some Days    Types: Cigarettes   Smokeless tobacco: Never   Tobacco comments:    Smokes 2-3 every 3-4 weeks, only when out with friends.   Vaping Use   Vaping status: Never Used  Substance and Sexual Activity   Alcohol use: Yes    Alcohol/week: 2.0 standard drinks of alcohol    Types: 2 Glasses of wine per week    Comment: not at the moment    Drug use: No   Sexual activity: Yes    Birth control/protection: I.U.D.    Comment: Mirena  Other Topics Concern   Not on file  Social History Narrative   Not on file   Social Determinants of Health   Financial Resource Strain: Medium Risk (02/22/2023)    Received from [facility], [facility]   Overall Financial Resource Strain (CARDIA)    Difficulty of Paying Living Expenses: Somewhat hard  Food Insecurity: Food Insecurity Present (02/22/2023)   [facility], [facility]   Hunger Vital Sign    Worried About Running Out of Food in the Last Year: Sometimes true    Ran Out of Food in the Last Year: Sometimes true  Transportation Needs: No Transportation Needs (02/22/2023)   Received from [facility], [facility]   [facility] - Transportation    In the past 12 months, has lack of transportation kept you from medical appointments or from getting medications?: No    Lack of Transportation (Non-Medical): No  Physical Activity: Inactive (08/03/2017)   Exercise Vital Sign    Days of Exercise per Week: 0 days    Minutes of Exercise per Session: 0 min  Stress: Stress Concern Present (08/03/2017)   [employer] of Occupational Health - Occupational Stress Questionnaire    Feeling of Stress : To some extent  Social Connections: Moderately Isolated (08/03/2017)   Social Connection and Isolation Panel [NHANES]    Frequency of Communication with Friends and Family: Once a week    Frequency of Social Gatherings with Friends and Family: Once a week    Attends Religious Services: Never    Database administrator or Organizations: No    Attends Engineer, structural: Never    Marital Status: Married    Allergies: No Known Allergies  Metabolic Disorder Labs: No results found for: "HGBA1C", "MPG" Lab Results  Component Value Date   PROLACTIN 12.5 04/25/2020   Lab Results  Component Value Date   CHOL 147 02/06/2016   TRIG 186 (H) 02/06/2016   HDL 36 (L) 02/06/2016   LDLCALC 74 02/06/2016   Lab Results  Component Value Date   TSH 0.780 02/06/2016    Therapeutic Level Labs: No results found for: "LITHIUM" No  results found for: "VALPROATE" No results found for: "CBMZ"  Current Medications: Current Outpatient Medications  Medication Sig Dispense Refill   ARIPiprazole (ABILIFY) 2 MG tablet Take 0.5 tablets (1 mg total) by mouth daily. 45 tablet 1   cyclobenzaprine (FEXMID) 7.5 MG tablet Take 7.5 mg by mouth 3 (three) times daily as needed for muscle spasms.     ipratropium (ATROVENT) 0.06 % nasal spray Place into both nostrils.     levonorgestrel (MIRENA) 20 MCG/24HR IUD by Intrauterine route once for 1 dose. 1 each 0  omeprazole (PRILOSEC) 20 MG capsule Take 20 mg by mouth daily.     propranolol (INDERAL) 10 MG tablet Take 10 mg by mouth 2 (two) times daily.     traZODone (DESYREL) 50 MG tablet Take 1 tablet (50 mg total) by mouth at bedtime as needed. for sleep (Patient not taking: Reported on 10/05/2022) 90 tablet 0   No current facility-administered medications for this visit.     Musculoskeletal: Strength & Muscle Tone: within normal limits Gait & Station: normal Patient leans: N/A  Psychiatric Specialty Exam: Review of Systems  Psychiatric/Behavioral: Negative.      Blood pressure 138/88, pulse 80, temperature (!) 96.9 F (36.1 C), temperature source Skin, height 5' 6.75" (1.695 m), weight 207 lb 3.2 oz (94 kg).Body mass index is 32.7 kg/m.  General Appearance: Fairly Groomed  Eye Contact:  Fair  Speech:  Clear and Coherent  Volume:  Normal  Mood:  Euthymic  Affect:  Congruent  Thought Process:  Goal Directed and Descriptions of Associations: Intact  Orientation:  Full (Time, Place, and Person)  Thought Content: Logical   Suicidal Thoughts:  No  Homicidal Thoughts:  No  Memory:  Immediate;   Fair Recent;   Fair Remote;   Fair  Judgement:  Fair  Insight:  Fair  Psychomotor Activity:  Normal  Concentration:  Concentration: Fair and Attention Span: Fair  Recall:  [employer] of Knowledge: Fair  Language: Fair  Akathisia:  No  Handed:  Right  AIMS (if indicated):  done   Assets:  Communication Skills Desire for Improvement Housing Resilience Social Support Talents/Skills Transportation  ADL's:  Intact  Cognition: WNL  Sleep:  Fair   Screenings: Geneticist, molecular Office Visit from 06/03/2023 in [facility] Office Visit from 01/11/2023 in [facility] Office Visit from 10/05/2022 in [facility] Office Visit from 07/01/2022 in [facility] Office Visit from 03/03/2022 in [facility]  AIMS Total Score 0 0 0 0 0      GAD-7    Flowsheet Row Office Visit from 06/03/2023 in [facility] Office Visit from 01/11/2023 in [facility] Office Visit from 10/05/2022 in [facility] Office Visit from 07/01/2022 in [facility] Office Visit from 03/03/2022 in [facility]  Total GAD-7 Score 5 4 2 5 2       PHQ2-9    Flowsheet Row Office Visit from 06/03/2023 in [facility] Office Visit from 01/11/2023 in [facility] Office Visit from 10/05/2022 in [facility] Office Visit from 07/01/2022 in [facility] Office Visit from 03/03/2022 in [facility]  PHQ-2 Total Score 0 1 0 2 0  PHQ-9 Total Score -- 3 -- 5 --      Flowsheet Row Office Visit from 06/03/2023 in [facility] ED from 03/21/2023 in [facility] Visit from 01/11/2023 in [facility]  C-SSRS RISK  CATEGORY Low Risk No Risk No Risk        Assessment and Plan: Lindsay Brown is a 37 year old Hispanic female who has a history of bipolar disorder, insomnia was evaluated in office today.  Patient is currently stable, interested in further tapering of Abilify.  Plan as noted below.  Plan Bipolar disorder in remission Will start Abilify 1 mg p.o. daily-reduced dosage Patient advised to monitor her symptoms closely and if she notices any worsening mood symptoms to increase the dosage to a 2 mg and reach out to provider.  Hydroxyzine 10 mg p.o. 3 times daily as needed Patient to continue CBT  Insomnia-stable Trazodone 50 mg p.o. nightly as needed , she rarely needs it. Continue sleep hygiene techniques.   I have reviewed cardiology notes-per [facility] health-Dr. Bobette Mo -patient's ambulatory heart rhythm monitoring showed rare PVCs and PACs.  Given improvement on propranolol will continue without change and no further evaluation needed at the current time.   Collaboration of Care: Collaboration of Care: Other I have reviewed cardiology notes as noted above.  Patient/Guardian was advised Release of Information must be obtained prior to any record release in order to collaborate their care with an outside provider. Patient/Guardian was advised if they have not already done so to contact the registration department to sign all necessary forms in order for [place] to release information regarding their care.   Consent: Patient/Guardian gives verbal consent for treatment and assignment of benefits for services provided during this visit. Patient/Guardian expressed understanding and agreed to proceed.   Follow-up in clinic in 6 months or sooner if needed.  This note was generated in part or whole with voice recognition software. Voice recognition is usually quite accurate but there are transcription errors that can and very often do occur. I apologize for any typographical errors that  were not detected and corrected.      Jomarie Longs, MD 06/04/2023, 2:00 PM

## 2023-11-01 DIAGNOSIS — I10 Essential (primary) hypertension: Secondary | ICD-10-CM | POA: Insufficient documentation

## 2023-11-01 DIAGNOSIS — M4802 Spinal stenosis, cervical region: Secondary | ICD-10-CM | POA: Insufficient documentation

## 2023-12-09 ENCOUNTER — Telehealth (INDEPENDENT_AMBULATORY_CARE_PROVIDER_SITE_OTHER): Payer: Self-pay | Admitting: Psychiatry

## 2023-12-09 ENCOUNTER — Encounter: Payer: Self-pay | Admitting: Psychiatry

## 2023-12-09 DIAGNOSIS — F3178 Bipolar disorder, in full remission, most recent episode mixed: Secondary | ICD-10-CM | POA: Diagnosis not present

## 2023-12-09 DIAGNOSIS — F17200 Nicotine dependence, unspecified, uncomplicated: Secondary | ICD-10-CM | POA: Diagnosis not present

## 2023-12-09 DIAGNOSIS — G4701 Insomnia due to medical condition: Secondary | ICD-10-CM

## 2023-12-09 MED ORDER — ARIPIPRAZOLE 2 MG PO TABS: 1.0000 mg | ORAL_TABLET | Freq: Every day | ORAL | 1 refills | Status: DC

## 2023-12-09 NOTE — Progress Notes (Unsigned)
 Virtual Visit via Video Note  I connected with Lindsay Brown on 12/09/23 at  3:30 PM EDT by a video enabled telemedicine application and verified that I am speaking with the correct person using two identifiers.  Location Provider Location : [facility] Patient Location : Work  Participants: Patient , Provider    I discussed the limitations of evaluation and management by telemedicine and the availability of in person appointments. The patient expressed understanding and agreed to proceed.   I discussed the assessment and treatment plan with the patient. The patient was provided an opportunity to ask questions and all were answered. The patient agreed with the plan and demonstrated an understanding of the instructions.   The patient was advised to call back or seek an in-person evaluation if the symptoms worsen or if the condition fails to improve as anticipated.  BH MD OP Progress Note  12/10/2023 12:53 PM Lindsay Brown  MRN:  [id number]  Chief Complaint:  Chief Complaint  Patient presents with   Follow-up   Depression   Anxiety   Manic Behavior   Medication Refill   HPI: Lindsay Brown is a 38 year old Hispanic female, single, employed, lives in [place], has a history of bipolar disorder, insomnia was evaluated by telemedicine today.  She is managing her bipolar disorder with 1 mg of Abilify, which she sometimes forgets to take approximately once a week due to changes in her routine or exhaustion. Overall, she feels stable with her bipolar symptoms and has not experienced significant mood swings or adverse effects from her current medication regimen.  She has increased her Wellbutrin dosage from 75 mg twice daily to 150 mg twice daily to aid in smoking cessation. Since the increase, she has significantly reduced her smoking, with periods of three to four days without smoking and no cravings. She attributes her smoking to boredom, particularly while driving, and is  considering alternatives like chewing on something to keep her occupied. She has informed her friends and family about her medication changes and smoking cessation efforts, asking them to monitor her for any mood changes or adverse effects.  She experiences severe headaches intermittently, for which she underwent an MRI with and without contrast and an MRI angiogram, both of which did not reveal any underlying cause. She is scheduled for a follow-up with a neurologist.  She  reports no issues with sleep, stating she sleeps well and does not require trazodone, which she has not used in three years. She sleeps soundly through the night, waking only once to use the bathroom.   Denies any other concerns today.  Visit Diagnosis:    ICD-10-CM   1. Bipolar disorder, in full remission, most recent episode mixed (HCC)  F31.78 ARIPiprazole (ABILIFY) 2 MG tablet   Type I    2. Insomnia due to medical condition  G47.01    Mood    3. Tobacco use disorder  F17.200       Past Psychiatric History: I have reviewed past psychiatric history from progress note on 01/03/2019.  Past Medical History:  Past Medical History:  Diagnosis Date   Arrhythmia    Asthma    Bipolar disorder (HCC)    Depression    GERD (gastroesophageal reflux disease)     Past Surgical History:  Procedure Laterality Date   BREAST BIOPSY Left 04/15/2020   [place] Bx. X-clip, path pending   TONSILLECTOMY  2011    Family Psychiatric History: I have reviewed family psychiatric history from progress note  on 01/03/2019.  Family History:  Family History  Problem Relation Age of Onset   Bipolar disorder Mother    Drug abuse Mother    Alcohol abuse Mother    Obesity Mother    Skin cancer Mother    Heart disease Mother    Ovarian cysts Mother    Prostate cancer Father    Depression Father    Drug abuse Father    Alcohol abuse Father    Thyroid cancer Sister    Depression Sister    Anxiety disorder Sister    Alcohol abuse  Brother    Drug abuse Brother    Anxiety disorder Brother    Breast cancer Neg Hx    Ovarian cancer Neg Hx     Social History: I have reviewed social history from progress note on 01/03/2019. Social History   Socioeconomic History   Marital status: Single    Spouse name: Not on file   Number of children: 3   Years of education: Not on file   Highest education level: Bachelor's degree (e.g., BA, AB, BS)  Occupational History   Occupation: [employer]    Comment: full time  Tobacco Use   Smoking status: Some Days    Types: Cigarettes   Smokeless tobacco: Never   Tobacco comments:    Smokes 2-3 every 3-4 weeks, only when out with friends.   Vaping Use   Vaping status: Never Used  Substance and Sexual Activity   Alcohol use: Yes    Alcohol/week: 2.0 standard drinks of alcohol    Types: 2 Glasses of wine per week    Comment: not at the moment    Drug use: No   Sexual activity: Yes    Birth control/protection: I.U.D.    Comment: Mirena  Other Topics Concern   Not on file  Social History Narrative   Not on file   Social Drivers of Health   Financial Resource Strain: Medium Risk (02/22/2023)   Received from [facility], [facility]   Overall Financial Resource Strain (CARDIA)    Difficulty of Paying Living Expenses: Somewhat hard  Food Insecurity: Food Insecurity Present (02/22/2023)   Received from [facility], [facility]   Hunger Vital Sign    Worried About Running Out of Food in the Last Year: Sometimes true    Ran Out of Food in the Last Year: Sometimes true  Transportation Needs: No Transportation Needs (02/22/2023)   Received from [facility], [facility]   [facility] - Transportation    In the past 12 months, has lack of transportation kept you from medical appointments or from getting medications?: No    Lack of Transportation (Non-Medical): No  Physical  Activity: Inactive (08/03/2017)   Exercise Vital Sign    Days of Exercise per Week: 0 days    Minutes of Exercise per Session: 0 min  Stress: Stress Concern Present (08/03/2017)   [employer] of Occupational Health - Occupational Stress Questionnaire    Feeling of Stress : To some extent  Social Connections: Moderately Isolated (08/03/2017)   Social Connection and Isolation Panel [NHANES]    Frequency of Communication with Friends and Family: Once a week    Frequency of Social Gatherings with Friends and Family: Once a week    Attends Religious Services: Never    Database administrator or Organizations: No    Attends Banker Meetings: Never  Marital Status: Married    Allergies: No Known Allergies  Metabolic Disorder Labs: No results found for: "HGBA1C", "MPG" Lab Results  Component Value Date   PROLACTIN 12.5 04/25/2020   Lab Results  Component Value Date   CHOL 147 02/06/2016   TRIG 186 (H) 02/06/2016   HDL 36 (L) 02/06/2016   LDLCALC 74 02/06/2016   Lab Results  Component Value Date   TSH 0.780 02/06/2016    Therapeutic Level Labs: No results found for: "LITHIUM" No results found for: "VALPROATE" No results found for: "CBMZ"  Current Medications: Current Outpatient Medications  Medication Sig Dispense Refill   buPROPion (WELLBUTRIN) 75 MG tablet Take 150 mg by mouth 2 (two) times daily.     ARIPiprazole (ABILIFY) 2 MG tablet Take 0.5 tablets (1 mg total) by mouth daily. 45 tablet 1   cyclobenzaprine (FEXMID) 7.5 MG tablet Take 7.5 mg by mouth 3 (three) times daily as needed for muscle spasms.     ipratropium (ATROVENT) 0.06 % nasal spray Place into both nostrils.     levonorgestrel (MIRENA) 20 MCG/24HR IUD by Intrauterine route once for 1 dose. 1 each 0   omeprazole (PRILOSEC) 20 MG capsule Take 20 mg by mouth daily.     propranolol (INDERAL) 10 MG tablet Take 10 mg by mouth 2 (two) times daily.     traZODone (DESYREL) 50 MG tablet Take 1  tablet (50 mg total) by mouth at bedtime as needed. for sleep (Patient not taking: Reported on 10/05/2022) 90 tablet 0   No current facility-administered medications for this visit.     Musculoskeletal: Strength & Muscle Tone:  UTA Gait & Station:  Seated Patient leans: N/A  Psychiatric Specialty Exam: Review of Systems  Psychiatric/Behavioral: Negative.      There were no vitals taken for this visit.There is no height or weight on file to calculate BMI.  General Appearance: Casual  Eye Contact:  Fair  Speech:  Clear and Coherent  Volume:  Normal  Mood:  Euthymic  Affect:  Appropriate  Thought Process:  Goal Directed and Descriptions of Associations: Intact  Orientation:  Full (Time, Place, and Person)  Thought Content: Logical   Suicidal Thoughts:  No  Homicidal Thoughts:  No  Memory:  Immediate;   Fair Recent;   Fair Remote;   Fair  Judgement:  Fair  Insight:  Fair  Psychomotor Activity:  Normal  Concentration:  Concentration: Fair and Attention Span: Fair  Recall:  [employer] of Knowledge: Fair  Language: Fair  Akathisia:  No  Handed:  Right  AIMS (if indicated): not done  Assets:  Communication Skills Desire for Improvement Housing Social Support  ADL's:  Intact  Cognition: WNL  Sleep:  Fair   Screenings: Geneticist, molecular Office Visit from 06/03/2023 in [facility] Office Visit from 01/11/2023 in [facility] Office Visit from 10/05/2022 in [facility] Office Visit from 07/01/2022 in [facility] Office Visit from 03/03/2022 in [facility]  AIMS Total Score 0 0 0 0 0      GAD-7    Flowsheet Row Office Visit from 06/03/2023 in [facility] Office Visit from 01/11/2023 in [facility] Office Visit from 10/05/2022 in [facility] Office Visit from 07/01/2022 in [facility] Office Visit from  03/03/2022 in [facility]  Total GAD-7 Score 5 4 2 5 2       PHQ2-9    Flowsheet Row Office Visit from 06/03/2023 in [facility] Office Visit from 01/11/2023 in [facility] Office Visit from 10/05/2022 in [facility] Office Visit from 07/01/2022 in [facility] Office Visit from 03/03/2022 in [facility]  PHQ-2 Total Score 0 1 0 2 0  PHQ-9 Total Score -- 3 -- 5 --      Flowsheet Row Video Visit from 12/09/2023 in [facility] Office Visit from 06/03/2023 in [facility] ED from 03/21/2023 in [facility] at [facility]  C-SSRS RISK CATEGORY No Risk Low Risk No Risk        Assessment and Plan: Lindsay Brown is a 38 year old Hispanic female who has a history of bipolar disorder, insomnia was evaluated by telemedicine today.  Discussed assessment and plan as noted below.  Bipolar Disorder in remission Bipolar disorder is well-controlled with Abilify 1 mg daily. Medication is occasionally missed once a week due to a busy schedule, but no significant mood swings or hypomanic symptoms are reported. She is aware of the potential for mood destabilization with medication changes and is monitoring for symptoms. The current dosage is effective without adverse effects. - Continue Abilify 1 mg daily. - Monitor for mood changes or hypomanic symptoms, especially with Wellbutrin dosage changes.  Insomnia-stable Currently reports sleep is overall good. - Continue sleep  hygiene techniques. - Continue Trazodone 50 mg at bedtime as needed.  Rarely takes it.  Tobacco use disorder-improving Currently on Wellbutrin prescribed by primary care provider. - Continue Wellbutrin 150 mg twice daily.  Denies any side effects including mood lability or irritability or sleep issues from the same.  Will recommend repeating lipid panel since on Abilify.  Patient to discuss with primary care provider.  Follow-up in clinic in 4 to 5 months or sooner in person.  Patient agrees to call the clinic back to schedule this appointment once she checks her work schedule.   Consent: Patient/Guardian gives verbal consent for treatment and assignment of benefits for services provided during this visit. Patient/Guardian expressed understanding and agreed to proceed.  This note was generated in part or whole with voice recognition software. Voice recognition is usually quite accurate but there are transcription errors that can and very often do occur. I apologize for any typographical errors that were not detected and corrected.   Discussed the use of a AI scribe software for clinical note transcription with the patient, who gave verbal consent to proceed.    Jomarie Longs, MD 12/10/2023, 12:53 PM

## 2024-03-01 NOTE — Progress Notes (Signed)
  [facility] Care Management - Unable to Reach for New Service Line Referral  [facility] Coordinator made multiple attempts to contact the patient for a community resources. Contact information was left with patient.  At this time, we have not been able to connect [employer] to [employer].   KAYE SEARCH  For more information on [facility] services, click here.

## 2024-03-01 NOTE — Progress Notes (Signed)
 [facility] Centralized Support Referral Closure Note Lindsay Brown was referred by provider for assistance with health-related social needs:  Care concerns addressed as described above. No further action needed at this time. Closing case.  Provider notified on: 03/01/24.     [facility]        [address], [address]; [place], [place] [address] l  [email or web address] l [phone]

## 2024-03-14 ENCOUNTER — Emergency Department
Admission: EM | Admit: 2024-03-14 | Discharge: 2024-03-14 | Disposition: A | Discharge status: Home / Self Care | Source: Ambulatory Visit

## 2024-03-14 ENCOUNTER — Encounter: Payer: Self-pay | Admitting: Emergency Medicine

## 2024-03-14 ENCOUNTER — Other Ambulatory Visit: Payer: Self-pay

## 2024-03-14 DIAGNOSIS — I10 Essential (primary) hypertension: Secondary | ICD-10-CM | POA: Diagnosis not present

## 2024-03-14 DIAGNOSIS — K1121 Acute sialoadenitis: Secondary | ICD-10-CM | POA: Insufficient documentation

## 2024-03-14 DIAGNOSIS — R519 Headache, unspecified: Secondary | ICD-10-CM | POA: Diagnosis present

## 2024-03-14 MED ORDER — LIDOCAINE VISCOUS HCL 2 % MT SOLN
15.0000 mL | Freq: Once | OROMUCOSAL | Status: AC
  Administered 2024-03-14: 15 mL via OROMUCOSAL
  Filled 2024-03-14: qty 15

## 2024-03-14 MED ORDER — LIDOCAINE VISCOUS HCL 2 % MT SOLN: 15.0000 mL | Freq: Four times a day (QID) | OROMUCOSAL | 0 refills | Status: AC | PRN

## 2024-03-14 NOTE — Discharge Instructions (Addendum)
 You were seen in the emergency department for an infection of your parotid glands.  Please pick up the mouthwash prescribed and take at home based on the instructions.  You can continue using ibuprofen at home for pain as well based on the instructions on the bottle.  Continue doing warm compresses with a rag, ice for any swelling, using sour lozenges or salivary-inducing gum, and taking your antibiotic provided to you from Dr. Trinidad.   Return to the emergency department for any difficulty breathing, difficulty swallowing, or for any new or worsening symptoms.

## 2024-03-14 NOTE — ED Provider Notes (Signed)
 [facility] Provider Note    Event Date/Time   First MD Initiated Contact with Patient 03/14/24 1836     (approximate)   History   Lymphadenopathy   HPI  Lindsay Brown is a 38 y.o. female presenting for pain and swelling to her left cheek that started yesterday.  Patient states this pain is worse when eating, even soft foods.  Also endorses xerostomia. Denies difficulty breathing, difficulty or pain with swallowing, jaw pain, teeth pain, fevers, chills, otalgia.  She has had these concerns before, but her pain has not been this bad.  Has tried ibuprofen and hot rice packs at home.  Patient states the rice packs caused some redness along the affected area, called her primary care provider's office and they stated she has come to the emergency department for evaluation and imaging.  Pain 5 out of 10 in triage. Past medical history includes anxiety, mixed bipolar 1, essential hypertension, cervical spine stenosis, depression.   Reviewed the PCP note from earlier today, she was seen for this concern, diagnosed with acute sialoadenitis, and prescribed Augmentin.  She has had 1 dose of Augmentin at home.  Physical Exam   Triage Vital Signs: ED Triage Vitals  Encounter Vitals Group     BP 03/14/24 1730 (!) 139/96     Girls Systolic BP Percentile --      Girls Diastolic BP Percentile --      Boys Systolic BP Percentile --      Boys Diastolic BP Percentile --      Pulse Rate 03/14/24 1730 70     Resp 03/14/24 1730 17     Temp 03/14/24 1730 98.9 F (37.2 C)     Temp Source 03/14/24 1730 Oral     SpO2 03/14/24 1730 97 %     Weight 03/14/24 1732 223 lb (101.2 kg)     Height 03/14/24 1732 5' 6 (1.676 m)     Head Circumference --      Peak Flow --      Pain Score 03/14/24 1732 5     Pain Loc --      Pain Education --      Exclude from Growth Chart --     Most recent vital signs: Vitals:   03/14/24 1730 03/14/24 1856  BP: (!) 139/96 (!) 135/96   Pulse: 70 72  Resp: 17   Temp: 98.9 F (37.2 C)   SpO2: 97% 98%    General: Well-appearing, in no acute distress. Appears stated age. Head: Normocephalic, atraumatic. Eyes: No scleral icterus or conjunctival injection. Ears/Nose/Throat: TMs intact b/l. Nares patent, no nasal discharge. Oropharynx moist, no erythema or exudate.  No fluctuant area. Dentition intact.  Neck: Supple, no nuchal rigidity.  No thyroid enlargement or cervical adenopathy. CV: Regular rate, 72 bpm.  Respiratory: No respiratory distress. Normal respiratory effort. GI: Soft, non-distended. Skin:Warm, dry, intact. No rashes, lesions, or ecchymosis. No cyanosis or pallor. Neurological: A&Ox4 to person, place, time, and situation.  Left-sided swelling noted surrounding the left parotid gland.  No trismus.  ED Results / Procedures / Treatments   Labs (all labs ordered are listed, but only abnormal results are displayed) Labs Reviewed - No data to display   EKG     RADIOLOGY    PROCEDURES:  Critical Care performed: No  Procedures   MEDICATIONS ORDERED IN ED: Medications  lidocaine (XYLOCAINE) 2 % viscous mouth solution 15 mL (15 mLs Mouth/Throat Given 03/14/24 1856)  IMPRESSION / MDM / ASSESSMENT AND PLAN / ED COURSE  I reviewed the triage vital signs and the nursing notes.                              Differential diagnosis includes, but is not limited to, parotitis, acute sialoadenitis, sialolithiasis, peritonsillar abscess, pharyngitis, retropharyngeal abscess  Patient's presentation is most consistent with acute, uncomplicated illness.  Patient is a 38 year old female who presented today with what seems to be parotitis.  She has swelling in the left parotid gland region.  This has happened multiple times before.  Reviewed her note from primary care earlier today with Dr. Trinidad regarding this concern.  Her concern is mostly the pain with eating even soft foods, so I did give her a dose  of viscous lidocaine here.  I will also prescribe this medication as well so she can at least get some relief while she attempts to eat at home.  She does not have any difficulty breathing, drooling, or trismus making me concerned for a peritonsillar or retropharyngeal abscess. We discussed continuing the antibiotic, warm compresses with a rag, using ibuprofen to help with pain. We also discussed continuing sialagogue usage.  Her blood pressure is elevated at 135/96, in triage was 139/96.  This may be due to pain or known hypertension; she does not seem to be on a antihypertensive other than propranolol. She can follow-up with her primary care provider regarding her elevated blood pressure reading or in the emergency department for any new or worsening symptoms.  Patient was given the opportunity to ask questions; all questions were answered. Emergency department return precautions were discussed with the patient.  Patient is in agreement to the treatment plan.  Patient is stable for discharge.    FINAL CLINICAL IMPRESSION(S) / ED DIAGNOSES   Final diagnoses:  Parotitis, acute     Rx / DC Orders   ED Discharge Orders          Ordered    lidocaine (XYLOCAINE) 2 % solution  Every 6 hours PRN        03/14/24 1908             Note:  This document was prepared using Dragon voice recognition software and may include unintentional dictation errors.    Sheron Salm, PA-C 03/14/24 1920    Clarine Ozell LABOR, MD 03/14/24 [phone]

## 2024-03-14 NOTE — Telephone Encounter (Signed)
 Provider Take Action Suggestions:    Reason for call: Patient is calling for:  Chief Complaint  Patient presents with  . erythema  . Edema    Pt seen by Dr Trinidad this AM Pt reports red marks around area of swelling  on left neck,  pain is worening, and redness is spreading Pt denies trouble breathing,  or hives Pt with soreness with swallowing , no drooling Speech is clear  Actions taken during call: Patient advised to call 911 if having an emergency or have someone assist with transportation to the nearest Emergency Room.  Triage: Triage completed, care advice given per protocol. Triage:Call back parameters given and caller advised of 24 hour nurse triage. Instructed to seek immediate medical attention if new symptoms develop, current symptoms worsen or if you become increasingly concerned. Patient verbalized understanding.    Pt says that she will go to [facility] REVONDA COMER, RN [facility] Patient Engagement Center  Patient Engagement Center Documentation    Reason for Disposition . Patient sounds very sick or weak to the triager  Additional Information . Negative: Sounds like a life-threatening emergency to the triager . Negative: Possible contact with poison ivy or oak . Negative: Insect bite(s) suspected . Negative: Athlete's Foot suspected (i.e., itchy rash between the toes) . Negative: Jock Itch suspected (i.e., itchy rash on inner thighs near genital area) . Negative: Wound infection suspected (i.e., pain, spreading redness, or pus; in a cut, puncture, scrape or sutured wound) . Negative: Rash of external female genital area (vulva) . Negative: Rash of penis or scrotum . Negative: Small spot, skin growth, or mole . Negative: Fever and localized purple or blood-colored spots or dots that are not from injury or friction . Negative: Fever and localized rash is very painful  Protocols used: Rash or Redness - Localized-A-OH

## 2024-03-14 NOTE — ED Triage Notes (Signed)
 Patient to ED via POV for swelling to the glands on the left side of neck. Started yesterday. Swells worse when eating. Denies difficulty breathing or swallowing. Seen at PCP this AM and given antibiotics- has taken 1 dose. NAD noted.

## 2024-03-14 NOTE — Progress Notes (Signed)
 Chief Complaint  Patient presents with  . Edema    Left side of face. Eating and drinking irritates it. Feels like it's in my mouth/ear     Subjective  Lindsay Brown is a 38 y.o. female who presents for Edema (Left side of face. Eating and drinking irritates it. Feels like it's in my mouth/ear ) HPI History of Present Illness Lindsay Brown is a 38 year old female who presents with recurrent swelling in the mouth.  She experiences swelling in her mouth approximately every two to three months. The swelling is significant and noticeable, particularly in the area of her mouth. The current episode began around noon the previous day after consuming tacos, which she described as 'super tangy'.  She attempted to alleviate the symptoms with ibuprofen and a warm compress, but it remains uncomfortable. The swelling is not like a ball but is still quite uncomfortable.  No associated teeth pain, fevers, or chills. She has tried to seek medical attention during previous flare-ups but has been unable to secure a same-day appointment.  Review of Systems See hpi Patient Active Problem List  Diagnosis  . Allergic state  . Depression  . Anxiety  . Left shoulder pain  . Spinal curvature  . Back pain, chronic  . Rotator cuff tendonitis  . Impingement syndrome of left shoulder  . Prediabetes  . Thyroid nodule  . Essential hypertension  . Stenosis of cervical spine    Outpatient Medications Prior to Visit  Medication Sig Dispense Refill  . acetaminophen (TYLENOL ORAL) Take by mouth as needed    . ARIPiprazole  (ABILIFY ) 2 MG tablet Take 1 mg by mouth once daily Take 1/2  tablet once daily    . buPROPion (WELLBUTRIN) 75 MG tablet Take 2 tablets (150 mg total) by mouth 2 (two) times daily 120 tablet 11  . hydrOXYzine  HCl (ATARAX ) 10 MG tablet Take 10 mg by mouth 3 (three) times daily as needed    . levonorgestreL  (MIRENA  52 MG) IUD Insert 1 each into the uterus once Follow package directions.    SABRA  omeprazole (PRILOSEC) 20 MG DR capsule Take 20 mg by mouth once daily    . propranoloL (INDERAL) 20 MG tablet Take 1 tablet (20 mg total) by mouth 2 (two) times daily 180 tablet 3  . melatonin 5 mg Cap 5 mg (Patient not taking: Reported on 03/14/2024)     No facility-administered medications prior to visit.      Objective  Vitals:   03/14/24 1026  BP: 116/77  Pulse: 71  Weight: (!) 101.2 kg (223 lb 3.2 oz)  PainSc:   4  PainLoc: Face   Body mass index is 36.03 kg/m.  Home Vitals:     Physical Exam Physical Exam    Constitutional: alert, in NAD, and communicates well Left sided swelling at jaw- tender at salivary gland. . Neck: supple, no thyroid enlargement or cervical adenopathy, and no bruits heard Respiratory: clear to auscultation, without rales or wheezes  Cardiovascular: regular rate and rhythm and without murmurs, rubs or gallops   Assessment/Plan:   Assessment & Plan Recurrent oral cavity swelling Suspected salivary gland involvement, possibly sialadenitis or sialolithiasis. - continue massage and warm compresses. Will treat with augmentin - Avoid known triggers. - Consider imaging studies of the salivary glands if symptoms persist or worsen. Diagnoses and all orders for this visit:  Acute sialoadenitis -     amoxicillin-clavulanate (AUGMENTIN) 875-125 mg tablet; Take 1 tablet (875 mg total) by mouth every  12 (twelve) hours for 7 days    This visit was coded based on medical decision making (MDM).    Future Appointments     Date/Time Provider Department Center Visit Type   04/06/2024 9:30 AM (Arrive by 9:00 AM) Nicholaus Greig Macintosh, PA [facility] INTERNAL REFERRAL       There are no Patient Instructions on file for this visit.  An after visit summary was provided for the patient either in written format (printed) or through [facility].  This note has been created using automated tools and reviewed for  accuracy by JONATHON COOPER HODGES.

## 2024-06-14 ENCOUNTER — Ambulatory Visit (INDEPENDENT_AMBULATORY_CARE_PROVIDER_SITE_OTHER): Admitting: Psychiatry

## 2024-06-14 ENCOUNTER — Other Ambulatory Visit: Payer: Self-pay

## 2024-06-14 ENCOUNTER — Other Ambulatory Visit: Payer: Self-pay | Admitting: Psychiatry

## 2024-06-14 ENCOUNTER — Encounter: Payer: Self-pay | Admitting: Psychiatry

## 2024-06-14 ENCOUNTER — Telehealth: Payer: Self-pay | Admitting: Psychiatry

## 2024-06-14 VITALS — BP 121/82 | HR 63 | Temp 97.3°F | Ht 66.0 in | Wt 220.4 lb

## 2024-06-14 DIAGNOSIS — F3178 Bipolar disorder, in full remission, most recent episode mixed: Secondary | ICD-10-CM | POA: Diagnosis not present

## 2024-06-14 DIAGNOSIS — F17200 Nicotine dependence, unspecified, uncomplicated: Secondary | ICD-10-CM | POA: Diagnosis not present

## 2024-06-14 DIAGNOSIS — G4701 Insomnia due to medical condition: Secondary | ICD-10-CM | POA: Diagnosis not present

## 2024-06-14 NOTE — Telephone Encounter (Signed)
 Received refill request for Abilify .  I have sent a 30-day supply.  No future refills without an appointment.

## 2024-06-14 NOTE — Progress Notes (Unsigned)
 [facility] MD OP Progress Note  06/14/2024 3:03 PM Lindsay Brown  MRN:  [id number]  Chief Complaint:  Chief Complaint  Patient presents with   Follow-up   Depression   Medication Refill   Manic Behavior   Discussed the use of AI scribe software for clinical note transcription with the patient, who gave verbal consent to proceed.  History of Present Illness Lindsay Brown is a 38 year old history manage female, single, employed, lives in [place], has a history of bipolar disorder, insomnia was evaluated in office today for a follow-up appointment.  She reports that her mood has been good and she feels she is in a good place currently. Attending therapy appointments every three weeks, she plans to transition to monthly sessions and notes that her therapist has expressed pride in her progress. 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 Engaging in these activities allowed her to maintain a sense of control and autonomy over her time, which she found beneficial for her well-being.  She describes some weight gain over the summer, which she relates to increased stress, decreased physical activity. She expresses awareness of the need to increase her activity level and improve her diet, noting that she previously worked with a nutritionist and found it helpful. She reports that she has become increasingly dissatisfied with her physical appearance due to the weight gain. She also describes challenges with meal timing due to her work schedule, often eating dinner late at night after her bartending shifts.  She confirms that she currently takes  Wellbutrin 150 mg twice daily, prescribed by her primary care provider, and Abilify  1 mg (half tablet) in the morning.   She denies any suicidality, homicidality or perceptual disturbances.   Visit Diagnosis:    ICD-10-CM   1. Bipolar disorder, in full remission, most recent episode mixed  F31.78    Type I    2. Insomnia due to medical condition  G47.01    Mood    3. Tobacco use disorder  F17.200       Past Psychiatric History: I have reviewed past psychiatric history from progress note on 01/03/2019.  Past Medical History:  Past Medical History:  Diagnosis Date   Arrhythmia    Asthma    Bipolar disorder (HCC)    Depression    GERD (gastroesophageal reflux disease)     Past Surgical History:  Procedure Laterality Date   BREAST BIOPSY Left 04/15/2020   US  Bx. X-clip, path pending   TONSILLECTOMY  2011    Family Psychiatric History: Reviewed family psychiatric history from progress note on 01/03/2019.  Family History:  Family History  Problem Relation Age of Onset   Bipolar disorder Mother    Drug abuse Mother    Alcohol abuse Mother    Obesity Mother    Skin cancer Mother    Heart disease Mother    Ovarian cysts Mother    Prostate cancer Father    Depression Father    Drug abuse Father    Alcohol abuse Father    Thyroid cancer Sister    Depression Sister    Anxiety disorder Sister    Alcohol abuse Brother  Drug abuse Brother    Anxiety disorder Brother    Breast cancer Neg Hx    Ovarian cancer Neg Hx     Social History: I have reviewed social history from progress note on 01/03/2019. Social History   Socioeconomic History   Marital status: Single    Spouse name: Not on file   Number of children: 3   Years of education: Not on file   Highest education level: Bachelor's degree (e.g., BA, AB, BS)  Occupational History   Occupation: [employer]    Comment: full time  Tobacco Use   Smoking status: Some Days    Types: Cigarettes   Smokeless  tobacco: Never   Tobacco comments:    Smokes 2-3 every 3-4 weeks, only when out with friends.   Vaping Use   Vaping status: Never Used  Substance and Sexual Activity   Alcohol use: Yes    Alcohol/week: 2.0 standard drinks of alcohol    Types: 2 Glasses of wine per week    Comment: not at the moment    Drug use: No   Sexual activity: Yes    Birth control/protection: I.U.D.    Comment: Mirena   Other Topics Concern   Not on file  Social History Narrative   Not on file   Social Drivers of Health   Financial Resource Strain: High Risk (02/22/2024)   Received from [facility]   Overall Financial Resource Strain (CARDIA)    Difficulty of Paying Living Expenses: Hard  Food Insecurity: Food Insecurity Present (02/22/2024)   Received from [facility]   Hunger Vital Sign    Within the past 12 months, you worried that your food would run out before you got the money to buy more.: Sometimes true    Within the past 12 months, the food you bought just didn't last and you didn't have money to get more.: Sometimes true  Transportation Needs: No Transportation Needs (02/22/2024)   Received from [facility] - Transportation    In the past 12 months, has lack of transportation kept you from medical appointments or from getting medications?: No    Lack of Transportation (Non-Medical): No  Physical Activity: Inactive (08/03/2017)   Exercise Vital Sign    Days of Exercise per Week: 0 days    Minutes of Exercise per Session: 0 min  Stress: Stress Concern Present (08/03/2017)   [employer] of Occupational Health - Occupational Stress Questionnaire    Feeling of Stress : To some extent  Social Connections: Moderately Isolated (08/03/2017)   Social Connection and Isolation Panel    Frequency of Communication with Friends and Family: Once a week    Frequency of Social Gatherings with Friends and Family: Once a week    Attends  Religious Services: Never    Database administrator or Organizations: No    Attends Engineer, structural: Never    Marital Status: Married    Allergies: No Known Allergies  Metabolic Disorder Labs: No results found for: HGBA1C, MPG Lab Results  Component Value Date   PROLACTIN 12.5 04/25/2020   Lab Results  Component Value Date   CHOL 147 02/06/2016   TRIG 186 (H) 02/06/2016   HDL 36 (L) 02/06/2016   LDLCALC 74 02/06/2016   Lab Results  Component Value Date   TSH 0.780 02/06/2016    Therapeutic Level Labs: No results found for: LITHIUM No results found for: VALPROATE No results found  for: CBMZ  Current Medications: Current Outpatient Medications  Medication Sig Dispense Refill   ARIPiprazole  (ABILIFY ) 2 MG tablet TAKE 0.5 TABLETS BY MOUTH DAILY. 15 tablet 0   buPROPion (WELLBUTRIN) 75 MG tablet Take 150 mg by mouth 2 (two) times daily.     cyclobenzaprine (FEXMID) 7.5 MG tablet Take 7.5 mg by mouth 3 (three) times daily as needed for muscle spasms.     ipratropium (ATROVENT) 0.06 % nasal spray Place into both nostrils.     levonorgestrel  (MIRENA ) 20 MCG/24HR IUD by Intrauterine route once for 1 dose. 1 each 0   omeprazole (PRILOSEC) 20 MG capsule Take 20 mg by mouth daily.     propranolol (INDERAL) 10 MG tablet Take 10 mg by mouth 2 (two) times daily.     traZODone  (DESYREL ) 50 MG tablet Take 1 tablet (50 mg total) by mouth at bedtime as needed. for sleep (Patient not taking: Reported on 10/05/2022) 90 tablet 0   No current facility-administered medications for this visit.     Musculoskeletal: Strength & Muscle Tone: within normal limits Gait & Station: normal Patient leans: N/A  Psychiatric Specialty Exam: Review of Systems  Psychiatric/Behavioral: Negative.      Blood pressure 121/82, pulse 63, temperature (!) 97.3 F (36.3 C), temperature source Temporal, height 5' 6 (1.676 m), weight 220 lb 6.4 oz (100 kg).Body mass index is 35.57 kg/m.   General Appearance: Casual  Eye Contact:  Fair  Speech:  Clear and Coherent  Volume:  Normal  Mood:  Euthymic  Affect:  Congruent  Thought Process:  Goal Directed and Descriptions of Associations: Intact  Orientation:  Full (Time, Place, and Person)  Thought Content: Logical   Suicidal Thoughts:  No  Homicidal Thoughts:  No  Memory:  Immediate;   Fair Recent;   Fair Remote;   Fair  Judgement:  Fair  Insight:  Fair  Psychomotor Activity:  Normal  Concentration:  Concentration: Fair and Attention Span: Fair  Recall:  [employer] of Knowledge: Fair  Language: Fair  Akathisia:  No  Handed:  Right  AIMS (if indicated): done  Assets:  Communication Skills Desire for Improvement Housing Social Support  ADL's:  Intact  Cognition: WNL  Sleep:  Fair   Screenings: Geneticist, molecular Office Visit from 06/03/2023 in [facility] Office Visit from 01/11/2023 in [facility] Office Visit from 10/05/2022 in [facility] Office Visit from 07/01/2022 in [facility] Office Visit from 03[facility]03/2022 in [facility]  AIMS Total Score 0 0 0 0 0   GAD-7    Flowsheet Row Office Visit from 06/03/2023 in [facility] Office Visit from 01/11/2023 in [facility] Office Visit from 10/05/2022 in [facility] Office Visit from 07/01/2022 in [facility] Office Visit from 03/03/2022 in [facility]  Total GAD-7 Score 5 4 2 5 2    PHQ2-9    Flowsheet Row Office Visit from 06/03/2023 in [facility] Office Visit from 01/11/2023 in [facility]  Office Visit from 10/05/2022 in [facility] Office Visit from 07/01/2022 in [facility] Office Visit from 03/03/2022 in [facility]  PHQ-2 Total Score 0 1 0  2 0  PHQ-9 Total Score -- 3 -- 5 --   Flowsheet Row ED from 03/14/2024 in [facility] Video Visit from 12/09/2023 in [facility] Office Visit from 06/03/2023 in [facility]  C-SSRS RISK CATEGORY No Risk No Risk Low Risk     Assessment and Plan: Ashaunti Treptow is a 38 year old Hispanic female who has a history of bipolar disorder was evaluated in office today.  Discussed assessment and plan as noted below.  1. Bipolar disorder, in full remission, most recent episode mixed Currently denies any significant mood swings. Continue Abilify  1 mg daily  2. Insomnia due to medical condition-stable Denies any significant sleep problems. Continue Trazodone  50 mg at bedtime as needed  3. Tobacco use disorder-improving Currently notes improvement. Continue Wellbutrin 150 mg daily prescribed by primary care provider.  Reviewed and discussed labs dated 02/22/2024 lipid panel-cholesterol 209-abnormal, LDL-elevated at 135, TSH-within normal limits, hemoglobin A1c-5.5-within normal limits.   Discussed lifestyle modification, exercise and diet modification.  Patient to also discuss weight management with primary care.  Will consider changing Abilify  to another mood stabilizer which is weight neutral in the future.  Follow-up Follow-up in clinic in 3 months or sooner if needed.    Collaboration of Care: Collaboration of Care: Primary Care Provider AEB encouraged to continue to follow-up with primary care provider.  Patient/Guardian was advised Release of Information must be obtained prior to any record  release in order to collaborate their care with an outside provider. Patient/Guardian was advised if they have not already done so to contact the registration department to sign all necessary forms in order for us  to release information regarding their care.   Consent: Patient/Guardian gives verbal consent for treatment and assignment of benefits for services provided during this visit. Patient/Guardian expressed understanding and agreed to proceed.  This note was generated in part or whole with voice recognition software. Voice recognition is usually quite accurate but there are transcription errors that can and very often do occur. I apologize for any typographical errors that were not detected and corrected.     Kalyn Dimattia, MD 06/14/2024, 3:03 PM

## 2024-09-19 ENCOUNTER — Ambulatory Visit (INDEPENDENT_AMBULATORY_CARE_PROVIDER_SITE_OTHER): Admitting: Psychiatry

## 2024-09-19 ENCOUNTER — Encounter: Payer: Self-pay | Admitting: Psychiatry

## 2024-09-19 ENCOUNTER — Other Ambulatory Visit: Payer: Self-pay

## 2024-09-19 VITALS — BP 110/70 | HR 87 | Temp 97.8°F | Ht 63.0 in | Wt 223.4 lb

## 2024-09-19 DIAGNOSIS — F17200 Nicotine dependence, unspecified, uncomplicated: Secondary | ICD-10-CM

## 2024-09-19 DIAGNOSIS — F3178 Bipolar disorder, in full remission, most recent episode mixed: Secondary | ICD-10-CM

## 2024-09-19 DIAGNOSIS — G4701 Insomnia due to medical condition: Secondary | ICD-10-CM

## 2024-09-19 NOTE — Progress Notes (Unsigned)
 BH MD OP Progress Note  09/19/2024 4:29 PM Lindsay Brown  MRN:  [id number]  Chief Complaint:  Chief Complaint  Patient presents with   Follow-up   Anxiety   Depression   Medication Refill   Discussed the use of AI scribe software for clinical note transcription with the patient, who gave verbal consent to proceed.  History of Present Illness Lindsay Brown is a 39 year old Hispanic female, single, employed, lives in [place], has a history of bipolar disorder, insomnia was evaluated in office today for a follow-up appointment.  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 During times without her children or work obligations, she primarily wants to sleep, though she denies feeling depressed or down. She notes that her friends do not observe signs of depression aside from her desire to rest, and she continues to find joy in teaching and enjoys social activities when possible.  Significant sleep disturbance impacts her, including difficulty falling and staying asleep, frequent nighttime awakenings, and daytime grogginess and fatigue. Periods of poor sleep lead to emotional distress, including crying at work. She links some of her sleep issues to stress, work demands, and possibly perimenopausal symptoms, though she reports that recent blood work showed normal results. She has tried melatonin gummies with variable benefit and keeps hydroxyzine  10 mg available, but she does not use it regularly due to morning grogginess. She also uses a night guard for TMJ symptoms and has experimented with a sunrise alarm clock to improve her morning routine.  She stopped Abilify  1 mg in the first week of October due to a significant increase in cost after insurance changes. She also discontinued Wellbutrin, which she had been prescribed to help with  smoking cessation, noting that she stopped it too quickly.  Financial stressors, including increased insurance premiums and a stagnant salary, have required her to take on additional work and contribute to her feeling overwhelmed. These pressures, combined with parenting responsibilities and limited time for self-care, contribute to her exhaustion. Structured activities and social accountability, such as yoga with a friend, help her maintain emotional stability.  She denies suicidal ideation, self-harm, or intent to harm others. She attends ongoing monthly therapy with Sharyne Simpers and describes a supportive network of friends and family.   Visit Diagnosis:    ICD-10-CM   1. Bipolar disorder, in full remission, most recent episode mixed  F31.78    Type I    2. Insomnia due to medical condition  G47.01    Mood    3. Tobacco use disorder  F17.200    mild      Past Psychiatric History: I have reviewed past psychiatric history from progress note on 01/03/2019.  Past Medical History:  Past Medical History:  Diagnosis Date   Arrhythmia    Asthma    Bipolar disorder (HCC)    Depression    GERD (gastroesophageal reflux disease)     Past Surgical History:  Procedure Laterality Date   BREAST BIOPSY Left 04/15/2020   US  Bx. X-clip, path pending   TONSILLECTOMY  2011    Family Psychiatric History: I have reviewed family psychiatric history from progress note on 01/03/2019.  Family History:  Family History  Problem Relation Age of Onset   Bipolar disorder Mother    Drug abuse Mother    Alcohol abuse Mother    Obesity Mother  Skin cancer Mother    Heart disease Mother    Ovarian cysts Mother    Prostate cancer Father    Depression Father    Drug abuse Father    Alcohol abuse Father    Thyroid cancer Sister    Depression Sister    Anxiety disorder Sister    Alcohol abuse Brother    Drug abuse Brother    Anxiety disorder Brother    Breast cancer Neg Hx    Ovarian  cancer Neg Hx     Social History: Reviewed social history from progress note on 01/03/2019. Social History   Socioeconomic History   Marital status: Single    Spouse name: Not on file   Number of children: 3   Years of education: Not on file   Highest education level: Bachelor's degree (e.g., BA, AB, BS)  Occupational History   Occupation: [employer]    Comment: full time  Tobacco Use   Smoking status: Some Days    Types: Cigarettes   Smokeless tobacco: Never   Tobacco comments:    Smokes 2-3 every 3-4 weeks, only when out with friends.   Vaping Use   Vaping status: Never Used  Substance and Sexual Activity   Alcohol use: Yes    Alcohol/week: 2.0 standard drinks of alcohol    Types: 2 Glasses of wine per week    Comment: not at the moment    Drug use: No   Sexual activity: Yes    Birth control/protection: I.U.D.    Comment: Mirena   Other Topics Concern   Not on file  Social History Narrative   Not on file   Social Drivers of Health   Tobacco Use: High Risk (09/19/2024)   Patient History    Smoking Tobacco Use: Some Days    Smokeless Tobacco Use: Never    Passive Exposure: Not on file  Financial Resource Strain: High Risk (02/22/2024)   Received from [facility]   Overall Financial Resource Strain (CARDIA)    Difficulty of Paying Living Expenses: Hard  Food Insecurity: Food Insecurity Present (02/22/2024)   Received from [facility]   Epic    Within the past 12 months, you worried that your food would run out before you got the money to buy more.: Sometimes true    Within the past 12 months, the food you bought just didn't last and you didn't have money to get more.: Sometimes true  Transportation Needs: No Transportation Needs (02/22/2024)   Received from [facility] - Transportation    In the past 12 months, has lack of transportation kept you from medical appointments or from getting  medications?: No    Lack of Transportation (Non-Medical): No  Physical Activity: Not on file  Stress: Not on file  Social Connections: Not on file  Depression (PHQ2-9): Low Risk (09/19/2024)   Depression (PHQ2-9)    PHQ-2 Score: 0  Alcohol Screen: Not on file  Housing: Low Risk  (02/22/2024)   Received from [facility]   Epic    In the last 12 months, was there a time when you were not able to pay the mortgage or rent on time?: No    In the past 12 months, how many times have you moved where you were living?: 0    At any time in the past 12 months, were you homeless or living in a shelter (including now)?: No  Utilities: Not  At Risk (02/22/2024)   Received from [facility]   Epic    In the past 12 months has the electric, gas, oil, or water company threatened to shut off services in your home?: No  Health Literacy: Not on file    Allergies: Allergies[1]  Metabolic Disorder Labs: No results found for: HGBA1C, MPG Lab Results  Component Value Date   PROLACTIN 12.5 04/25/2020   Lab Results  Component Value Date   CHOL 147 02/06/2016   TRIG 186 (H) 02/06/2016   HDL 36 (L) 02/06/2016   LDLCALC 74 02/06/2016   Lab Results  Component Value Date   TSH 0.780 02/06/2016    Therapeutic Level Labs: No results found for: LITHIUM No results found for: VALPROATE No results found for: CBMZ  Current Medications: Current Outpatient Medications  Medication Sig Dispense Refill   cyclobenzaprine (FEXMID) 7.5 MG tablet Take 7.5 mg by mouth 3 (three) times daily as needed for muscle spasms.     hydrOXYzine  (ATARAX ) 10 MG tablet Take 10 mg by mouth 3 (three) times daily as needed for anxiety (sleep).     ipratropium (ATROVENT) 0.06 % nasal spray Place into both nostrils.     levonorgestrel  (MIRENA ) 20 MCG/24HR IUD by Intrauterine route once for 1 dose. 1 each 0   omeprazole (PRILOSEC) 20 MG capsule Take 20 mg by mouth daily.     propranolol  (INDERAL) 10 MG tablet Take 10 mg by mouth 2 (two) times daily.     No current facility-administered medications for this visit.     Musculoskeletal: Strength & Muscle Tone: within normal limits Gait & Station: normal Patient leans: N/A  Psychiatric Specialty Exam: Review of Systems  Psychiatric/Behavioral:  The patient is nervous/anxious.     Blood pressure 110/70, pulse 87, temperature 97.8 F (36.6 C), temperature source Temporal, height 5' 3 (1.6 m), weight 223 lb 6.4 oz (101.3 kg).Body mass index is 39.57 kg/m.  General Appearance: Casual  Eye Contact:  Fair  Speech:  Clear and Coherent  Volume:  Normal  Mood:  Anxious coping okay  Affect:  Appropriate  Thought Process:  Goal Directed and Descriptions of Associations: Intact  Orientation:  Full (Time, Place, and Person)  Thought Content: Logical   Suicidal Thoughts:  No  Homicidal Thoughts:  No  Memory:  Immediate;   Fair Recent;   Fair Remote;   Fair  Judgement:  Fair  Insight:  Fair  Psychomotor Activity:  Normal  Concentration:  Concentration: Fair and Attention Span: Fair  Recall:  [employer] of Knowledge: Fair  Language: Fair  Akathisia:  No  Handed:  Right  AIMS (if indicated): not done  Assets:  Manufacturing Systems Engineer Desire for Improvement Housing Social Support Transportation  ADL's:  Intact  Cognition: WNL  Sleep:  Improving   Screenings: Geneticist, Molecular Office Visit from 06/14/2024 in [facility] Office Visit from 06/03/2023 in [facility] Office Visit from 01/11/2023 in [facility] Office Visit from 10/05/2022 in [facility] Office Visit from 07/01/2022 in [facility]  AIMS Total Score 0 0 0 0 0   GAD-7    Flowsheet Row Office Visit from 09/19/2024 in [facility] Office Visit from 06/14/2024 in [facility] Office Visit from 06/03/2023 in [facility] Office Visit from  01/11/2023 in [facility] Office Visit from 10/05/2022 in [facility]  Total GAD-7 Score 4 1 5 4 2    PHQ2-9    Flowsheet Row Office Visit from 09/19/2024 in [facility] Office Visit from 06/14/2024 in [facility] Office Visit from 06/03/2023 in [facility] Office Visit from 01/11/2023 in [facility] Office Visit from 10/05/2022 in [facility]  PHQ-2 Total Score 0 0 0 1 0  PHQ-9 Total Score -- -- -- 3 --   Flowsheet Row Office Visit from 09/19/2024 in [facility] Office Visit from 06/14/2024 in [facility] ED from 03/14/2024 in [facility] Emergency Department at [facility]  C-SSRS RISK CATEGORY No Risk No Risk No Risk     Assessment and Plan: Rogina Schiano is a 39 year old Hispanic female who presented for a follow-up appointment, discussed assessment and plan as noted below.  1. Bipolar disorder, in full remission, most recent episode mixed Currently denies any significant depression mania or hypomanic symptoms.  Currently noncompliant on Abilify  due to insurance plan change and cost.  Agrees to monitor symptoms closely and let this provider know if interested in initiation of another mood stabilizer. Declines initiation of another mood stabilizer at this time. Encouraged to continue psychotherapy sessions with Ms. Madelene Harvey   2. Insomnia due to medical condition-improving Does have ongoing sleep problems.  Trazodone   made her groggy. Restart Hydroxyzine  10 to 30 mg daily as needed for anxiety and sleep. Encouraged to work on sleep hygiene techniques.  3. Tobacco use disorder-unstable Continues to smoke cigarettes.  Stop Wellbutrin due to cost. Will reevaluate in future sessions.  Not ready to quit at this time.  Follow-up Follow-up in clinic in 3 months or sooner if needed.    Collaboration of Care: Collaboration of Care: Referral or follow-up with counselor/therapist AEB encouraged to continue psychotherapy sessions.  Patient/Guardian was advised Release of Information must be obtained prior to any record release in order to collaborate their care with an outside provider. Patient/Guardian was advised if they have not already done so to contact the registration department to sign all necessary forms in order for us  to release information regarding their care.   Consent: Patient/Guardian gives verbal consent for treatment and assignment of benefits for services provided during this visit. Patient/Guardian expressed understanding and agreed to proceed.   This note was generated in part or whole with voice recognition software. Voice recognition is usually quite accurate but there are transcription errors that can and very often do occur. I apologize for any typographical errors that were not detected and corrected.    Fredick Schlosser, MD 09/20/2024, 10:48 AM     [1] No Known Allergies

## 2024-12-18 ENCOUNTER — Ambulatory Visit: Admitting: Psychiatry
